# Patient Record
Sex: Female | Born: 1970 | Hispanic: No | Marital: Married | Smoking: Current some day smoker
Health system: Southern US, Community
[De-identification: ages and names within clinical notes are randomized; demographics above are authoritative.]

## PROBLEM LIST (undated history)

## (undated) DIAGNOSIS — M1990 Unspecified osteoarthritis, unspecified site: Secondary | ICD-10-CM

## (undated) DIAGNOSIS — E042 Nontoxic multinodular goiter: Secondary | ICD-10-CM

## (undated) DIAGNOSIS — J302 Other seasonal allergic rhinitis: Secondary | ICD-10-CM

## (undated) DIAGNOSIS — I10 Essential (primary) hypertension: Secondary | ICD-10-CM

## (undated) HISTORY — DX: Nontoxic multinodular goiter: E04.2

## (undated) HISTORY — PX: BUNIONECTOMY: SHX129

---

## 2001-10-23 ENCOUNTER — Encounter (INDEPENDENT_AMBULATORY_CARE_PROVIDER_SITE_OTHER): Payer: Self-pay | Admitting: *Deleted

## 2001-10-23 LAB — CONVERTED CEMR LAB

## 2001-11-08 ENCOUNTER — Encounter: Admission: RE | Admit: 2001-11-08 | Discharge: 2001-11-08 | Payer: Self-pay | Admitting: Family Medicine

## 2001-11-11 ENCOUNTER — Encounter: Admission: RE | Admit: 2001-11-11 | Discharge: 2001-11-11 | Payer: Self-pay | Admitting: Sports Medicine

## 2001-11-29 ENCOUNTER — Encounter: Admission: RE | Admit: 2001-11-29 | Discharge: 2001-11-29 | Payer: Self-pay | Admitting: Family Medicine

## 2001-12-16 ENCOUNTER — Inpatient Hospital Stay (HOSPITAL_COMMUNITY): Admission: AD | Admit: 2001-12-16 | Discharge: 2001-12-17 | Payer: Self-pay | Admitting: *Deleted

## 2003-07-17 ENCOUNTER — Encounter: Admission: RE | Admit: 2003-07-17 | Discharge: 2003-07-17 | Payer: Self-pay | Admitting: Specialist

## 2006-08-07 ENCOUNTER — Encounter: Admission: RE | Admit: 2006-08-07 | Discharge: 2006-11-05 | Payer: Self-pay | Admitting: Obstetrics and Gynecology

## 2006-10-10 ENCOUNTER — Inpatient Hospital Stay (HOSPITAL_COMMUNITY): Admission: AD | Admit: 2006-10-10 | Discharge: 2006-10-10 | Payer: Self-pay | Admitting: Obstetrics and Gynecology

## 2006-10-11 ENCOUNTER — Inpatient Hospital Stay (HOSPITAL_COMMUNITY): Admission: AD | Admit: 2006-10-11 | Discharge: 2006-10-14 | Payer: Self-pay | Admitting: Obstetrics and Gynecology

## 2006-10-11 ENCOUNTER — Encounter (INDEPENDENT_AMBULATORY_CARE_PROVIDER_SITE_OTHER): Payer: Self-pay | Admitting: Specialist

## 2006-10-15 ENCOUNTER — Encounter: Admission: RE | Admit: 2006-10-15 | Discharge: 2006-10-19 | Payer: Self-pay | Admitting: Obstetrics and Gynecology

## 2006-10-23 ENCOUNTER — Encounter (INDEPENDENT_AMBULATORY_CARE_PROVIDER_SITE_OTHER): Payer: Self-pay | Admitting: *Deleted

## 2007-10-04 ENCOUNTER — Encounter: Admission: RE | Admit: 2007-10-04 | Discharge: 2007-10-04 | Payer: Self-pay | Admitting: Family Medicine

## 2007-10-22 ENCOUNTER — Encounter (INDEPENDENT_AMBULATORY_CARE_PROVIDER_SITE_OTHER): Payer: Self-pay | Admitting: Interventional Radiology

## 2007-10-22 ENCOUNTER — Other Ambulatory Visit: Admission: RE | Admit: 2007-10-22 | Discharge: 2007-10-22 | Payer: Self-pay | Admitting: Interventional Radiology

## 2007-10-22 ENCOUNTER — Encounter: Admission: RE | Admit: 2007-10-22 | Discharge: 2007-10-22 | Payer: Self-pay | Admitting: Family Medicine

## 2007-11-05 DIAGNOSIS — R5381 Other malaise: Secondary | ICD-10-CM

## 2007-11-05 DIAGNOSIS — R5383 Other fatigue: Secondary | ICD-10-CM

## 2007-11-05 DIAGNOSIS — D72829 Elevated white blood cell count, unspecified: Secondary | ICD-10-CM | POA: Insufficient documentation

## 2007-11-05 DIAGNOSIS — R110 Nausea: Secondary | ICD-10-CM | POA: Insufficient documentation

## 2007-11-08 ENCOUNTER — Encounter: Payer: Self-pay | Admitting: Endocrinology

## 2007-12-27 ENCOUNTER — Emergency Department (HOSPITAL_COMMUNITY): Admission: EM | Admit: 2007-12-27 | Discharge: 2007-12-27 | Payer: Self-pay | Admitting: Emergency Medicine

## 2008-07-13 ENCOUNTER — Other Ambulatory Visit: Admission: RE | Admit: 2008-07-13 | Discharge: 2008-07-13 | Payer: Self-pay | Admitting: Family Medicine

## 2009-07-25 ENCOUNTER — Other Ambulatory Visit: Admission: RE | Admit: 2009-07-25 | Discharge: 2009-07-25 | Payer: Self-pay | Admitting: Family Medicine

## 2009-07-26 ENCOUNTER — Encounter: Admission: RE | Admit: 2009-07-26 | Discharge: 2009-07-26 | Payer: Self-pay | Admitting: Family Medicine

## 2009-09-30 ENCOUNTER — Emergency Department (HOSPITAL_COMMUNITY): Admission: EM | Admit: 2009-09-30 | Discharge: 2009-10-01 | Payer: Self-pay | Admitting: Emergency Medicine

## 2009-10-15 ENCOUNTER — Ambulatory Visit (HOSPITAL_COMMUNITY): Admission: RE | Admit: 2009-10-15 | Discharge: 2009-10-15 | Payer: Self-pay | Admitting: Psychiatry

## 2009-10-18 ENCOUNTER — Ambulatory Visit: Payer: Self-pay | Admitting: Psychiatry

## 2009-10-18 ENCOUNTER — Other Ambulatory Visit (HOSPITAL_COMMUNITY): Admission: RE | Admit: 2009-10-18 | Discharge: 2009-10-31 | Payer: Self-pay | Admitting: Psychiatry

## 2010-01-28 ENCOUNTER — Encounter: Admission: RE | Admit: 2010-01-28 | Discharge: 2010-01-28 | Payer: Self-pay | Admitting: Family Medicine

## 2010-02-01 ENCOUNTER — Encounter: Admission: RE | Admit: 2010-02-01 | Discharge: 2010-02-01 | Payer: Self-pay | Admitting: Family Medicine

## 2010-05-31 ENCOUNTER — Encounter: Admission: RE | Admit: 2010-05-31 | Discharge: 2010-05-31 | Payer: Self-pay | Admitting: Specialist

## 2010-09-15 ENCOUNTER — Encounter: Payer: Self-pay | Admitting: Family Medicine

## 2010-11-13 LAB — COMPREHENSIVE METABOLIC PANEL
ALT: 15 U/L (ref 0–35)
AST: 14 U/L (ref 0–37)
Albumin: 3.9 g/dL (ref 3.5–5.2)
Alkaline Phosphatase: 63 U/L (ref 39–117)
BUN: 11 mg/dL (ref 6–23)
CO2: 25 mEq/L (ref 19–32)
Calcium: 8.8 mg/dL (ref 8.4–10.5)
Chloride: 101 mEq/L (ref 96–112)
Creatinine, Ser: 0.62 mg/dL (ref 0.4–1.2)
GFR calc Af Amer: >60 mL/min (ref >60)
GFR calc non Af Amer: >60 mL/min (ref >60)
Glucose, Bld: 103 mg/dL — ABNORMAL HIGH (ref 70–99)
Potassium: 3.4 mEq/L — ABNORMAL LOW (ref 3.5–5.1)
Sodium: 135 mEq/L (ref 135–145)
Total Bilirubin: 0.4 mg/dL (ref 0.3–1.2)
Total Protein: 7 g/dL (ref 6.0–8.3)

## 2010-11-13 LAB — DIFFERENTIAL
Basophils Absolute: 0 10*3/uL (ref 0.0–0.1)
Basophils Relative: 0 % (ref 0–1)
Eosinophils Absolute: 0.1 10*3/uL (ref 0.0–0.7)
Eosinophils Relative: 1 % (ref 0–5)
Lymphocytes Relative: 23 % (ref 12–46)
Lymphs Abs: 2.3 10*3/uL (ref 0.7–4.0)
Monocytes Absolute: 0.7 10*3/uL (ref 0.1–1.0)
Monocytes Relative: 7 % (ref 3–12)
Neutro Abs: 7 10*3/uL (ref 1.7–7.7)
Neutrophils Relative %: 69 % (ref 43–77)

## 2010-11-13 LAB — URINALYSIS, ROUTINE W REFLEX MICROSCOPIC
Bilirubin Urine: NEGATIVE
Glucose, UA: NEGATIVE mg/dL
Hgb urine dipstick: NEGATIVE
Ketones, ur: NEGATIVE mg/dL
Nitrite: NEGATIVE
Protein, ur: NEGATIVE mg/dL
Specific Gravity, Urine: 1.007 (ref 1.005–1.030)
Urobilinogen, UA: 0.2 mg/dL (ref 0.0–1.0)
pH: 7.5 (ref 5.0–8.0)

## 2010-11-13 LAB — ACETAMINOPHEN LEVEL: Acetaminophen (Tylenol), Serum: <10 ug/mL — ABNORMAL LOW (ref 10–30)

## 2010-11-13 LAB — CBC
HCT: 36.3 % (ref 36.0–46.0)
Hemoglobin: 12.5 g/dL (ref 12.0–15.0)
MCHC: 34.4 g/dL (ref 30.0–36.0)
MCV: 88.7 fL (ref 78.0–100.0)
Platelets: 179 10*3/uL (ref 150–400)
RBC: 4.09 MIL/uL (ref 3.87–5.11)
RDW: 12.7 % (ref 11.5–15.5)
WBC: 10.2 10*3/uL (ref 4.0–10.5)

## 2010-11-13 LAB — RAPID URINE DRUG SCREEN, HOSP PERFORMED
Amphetamines: NOT DETECTED
Barbiturates: NOT DETECTED
Benzodiazepines: NOT DETECTED
Cocaine: NOT DETECTED
Opiates: NOT DETECTED
Tetrahydrocannabinol: NOT DETECTED

## 2010-11-13 LAB — PREGNANCY, URINE: Preg Test, Ur: NEGATIVE

## 2010-11-13 LAB — ETHANOL: Alcohol, Ethyl (B): <5 mg/dL (ref 0–10)

## 2011-01-10 NOTE — Consult Note (Signed)
NAME:  Brianna Dixon, Brianna Dixon NO.:  [id number]   MEDICAL RECORD NO.:  [id number]          PATIENT TYPE:  MAT   LOCATION:  [facility]                          FACILITY:  [facility]   PHYSICIAN:  Lenoard Aden, M.D.DATE OF BIRTH:  1971/07/18   DATE OF CONSULTATION:  DATE OF DISCHARGE:                                 CONSULTATION   CHIEF COMPLAINT:  Rule out labor.   HISTORY OF PRESENT ILLNESS:  She is a 40 year old [place] female, G5,  P4, at 61 weeks' gestation with increased frequency of contractions  today.   ALLERGIES:  SHE HAS NO KNOWN DRUG ALLERGIES.   MEDICATIONS:  Prenatal vitamins.   FAMILY HISTORY:  Hypertension, heart disease, diabetes, thyroid disease.   SOCIAL HISTORY:  She is a nonsmoker, nondrinker, denies domestic or  physical violence.  She has a personal history of no specific medical  problems.   Pregnancy complicated by gestational diabetes.  She has a history of  four uncomplicated vaginal deliveries.  Prenatal course complicated by  borderline compliance diabetes with elevated fasting and 2-hour  postprandials with marginal elevation not on any medication.  Fetal  surveillance has been otherwise reassuring.  Ultrasound is pending.   PHYSICAL EXAMINATION:  VITAL SIGNS:  Stable. She is afebrile.  HEENT:  Normal.  NECK:  Supple with full range of motion.  LUNGS:  Clear.  HEART:  Regular rhythm.  ABDOMEN:  Soft, gravid, nontender.  Breech by Leopold's.  No CVA  tenderness.  SKIN:  Intact.  NEUROLOGIC:  Nonfocal.  PELVIC:  Cervix is closed, long, presenting part is not palpable.   NST is reactive with irregular contractions noted.  Ultrasound done in  Maternity Admissions reveals a breech presentation with the head in the  left maternal upper quadrant.   IMPRESSION:  1. A 38 weeks obstetrical patient.  2. Gestational diabetes.  3. Breech malpresentation   PLAN:  1. We will evaluate for labor, cervical change noted.  2. We will proceed with  C-section.  3. Otherwise, she will followup in the office in 48 hours for a repeat      ultrasound and possible attempt at external cephalic conversion.  4. Strict labor warnings given.      Lenoard Aden, M.D.  Electronically Signed     RJT/MEDQ  D:  10/10/2006  T:  10/10/2006  Job:  [id number]   cc:   [facility]

## 2011-01-10 NOTE — Discharge Summary (Signed)
NAME:  Brianna Dixon, Brianna Dixon NO.:  [id number]   MEDICAL RECORD NO.:  [id number]          PATIENT TYPE:  INP   LOCATION:  9134                          [facility]ACILITY:  [facility]   PHYSICIAN:  Lenoard Aden, M.D.DATE OF BIRTH:  07-29-1971   DATE OF ADMISSION:  10/11/2006  DATE OF DISCHARGE:  10/14/2006                               DISCHARGE SUMMARY   The patient underwent uncomplicated C-section for breech of presentation  with spontaneous rupture of membranes and prolapsed cord at [facility] October 11, 2006. Postoperative course uncomplicated.  Discharged to home day #3. Discharge teaching done. Tylox, prenatal  vitamins, and iron given. Follow up in the office in 4-6 weeks. Birth  control to include tubal ligation performed at the time of surgery.      Lenoard Aden, M.D.  Electronically Signed     RJT/MEDQ  D:  11/13/2006  T:  11/13/2006  Job:  [id number]

## 2011-01-10 NOTE — Op Note (Signed)
NAME:  Brianna Dixon, Brianna Dixon NO.:  [id number]   MEDICAL RECORD NO.:  [id number]          PATIENT TYPE:  INP   LOCATION:  9134                          [facility]ILITY:  [facility]   PHYSICIAN:  Lenoard Aden, M.D.DATE OF BIRTH:  11/24/1970   DATE OF PROCEDURE:  10/11/2006  DATE OF DISCHARGE:                               OPERATIVE REPORT   PREOPERATIVE DIAGNOSES:  1. Intrauterine pregnancy at 38 weeks.  2. Gestational diabetes.  3. Footling breech, in active labor.  4. Spontaneous rupture of membranes with desire for elective      sterilization.  5. Prolapsed cord and foot.   POSTOPERATIVE DIAGNOSES:  1. Intrauterine pregnancy at 38 weeks.  2. Gestational diabetes.  3. Footling breech, in active labor.  4. Spontaneous rupture of membranes with desire for elective      sterilization.  5. Prolapsed cord and foot.   PROCEDURE:  1. Primary low segment transverse cesarean section.  2. Tubal ligation.   SURGEON:  Lenoard Aden, M.D.   ANESTHESIA:  Spinal.   ANESTHESIOLOGIST:  Octaviano Glow. Pamalee Leyden, M.D.   ESTIMATED BLOOD LOSS:  1500 mL.   COMPLICATIONS:  None.   DRAINS:  Foley.   COUNTS:  Correct.   DISPOSITION:  Patient to recovery in good condition.   FINDINGS:  Full-term living female, footling breech position, Apgars 8 and  9.  Cord pH 7.29.   BRIEF OPERATIVE NOTE:  After being apprised of the risks of anesthesia,  infection, bleeding, injury to abdominal organs and need for repair.  Delayed versus immediate complications to include:  bowel and bladder  injury, failure risk of tubal ligation 5-10:1,000.  The patient was  brought to the operating room.  She was administered a spinal anesthetic  without complications; prepped and draped in usual sterile fashion.  Foley catheter placed .  After attaining adequate anesthesia, dilute  Marcaine solution placed.  A Pfannenstiel skin incision made with the  scalpel, carried down to fascia -- which was nicked in the  midline and  extended transversely using Mayo scissors.  Rectus muscles dissected  sharply in midline.  Peritoneum entered sharply.  Bladder blade placed.  Visceroperitoneum scored sharply at the lower uterine segment.  Kerr  hysterotomy incision made.  Atraumatic delivery of a footling breech  presentation.  Infant handed to the pediatricians in attendance.  After  normal maneuvers, bulb suctioning done; Apgars 8 and 9.  Cord pH 7.29.  Placenta delivered manually intact from anterior position.  Cord blood  collected.  Uterus exteriorized and curetted using a dry lap pack, and  closed in 2 running imbricating layers of 0 Monocryl suture.  Multiple  interrupted sutures had to be placed at the left and right lateral edges  due to bleeding; and an O'Leary stitch was placed at the left lateral  edge to control hemostasis.  The uterine artery ligation done without  difficulty.  Good hemostasis noted.  Irrigation accomplished.  The right  tube traced out to the fimbriated end, and the right isthmic portion of  the tube was identified.  Avascular portion of the mesosalpinx was  cauterized, creating a window.  Proximal and distal plain ties were  placed.  Tubal segment was excised.  Tubal lumens were visualized and  cauterized.  The same procedure that was done on the right tube is done  on the left tube, and the left tubal segment is also excised.  At this  time good hemostasis was noted.  Irrigation re-accomplished.  Uterus  replaced into the abdominal cavity.  Bladder flap inspected, found to be  hemostatic.  Fascia then closed using a 0 Monocryl suture in running  fashion.  Subcutaneous tissue closed using 2-0 plain in a continuous  running fashion.  Skin closed using staples.  The patient tolerated this  procedure well; went to recovery in good condition.      Lenoard Aden, M.D.  Electronically Signed     RJT/MEDQ  D:  10/11/2006  T:  10/11/2006  Job:  [id number]

## 2011-03-24 ENCOUNTER — Other Ambulatory Visit: Payer: Self-pay | Admitting: Family Medicine

## 2011-03-24 DIAGNOSIS — Z1231 Encounter for screening mammogram for malignant neoplasm of breast: Secondary | ICD-10-CM

## 2011-04-03 ENCOUNTER — Ambulatory Visit
Admission: RE | Admit: 2011-04-03 | Discharge: 2011-04-03 | Disposition: A | Discharge status: Home / Self Care | Payer: Commercial Indemnity | Source: Ambulatory Visit | Attending: Family Medicine | Admitting: Family Medicine

## 2011-04-03 DIAGNOSIS — Z1231 Encounter for screening mammogram for malignant neoplasm of breast: Secondary | ICD-10-CM

## 2012-08-11 ENCOUNTER — Other Ambulatory Visit: Payer: Self-pay | Admitting: Family Medicine

## 2012-08-11 ENCOUNTER — Other Ambulatory Visit (HOSPITAL_COMMUNITY)
Admission: RE | Admit: 2012-08-11 | Discharge: 2012-08-11 | Disposition: A | Discharge status: Home / Self Care | Payer: Commercial Indemnity | Source: Ambulatory Visit | Attending: Family Medicine | Admitting: Family Medicine

## 2012-08-11 DIAGNOSIS — Z124 Encounter for screening for malignant neoplasm of cervix: Secondary | ICD-10-CM | POA: Insufficient documentation

## 2014-08-11 ENCOUNTER — Other Ambulatory Visit (HOSPITAL_COMMUNITY)
Admission: RE | Admit: 2014-08-11 | Discharge: 2014-08-11 | Disposition: A | Discharge status: Home / Self Care | Payer: BC Managed Care – PPO | Source: Ambulatory Visit | Attending: Family Medicine | Admitting: Family Medicine

## 2014-08-11 ENCOUNTER — Other Ambulatory Visit: Payer: Self-pay | Admitting: Family Medicine

## 2014-08-11 DIAGNOSIS — E049 Nontoxic goiter, unspecified: Secondary | ICD-10-CM

## 2014-08-11 DIAGNOSIS — Z1151 Encounter for screening for human papillomavirus (HPV): Secondary | ICD-10-CM | POA: Diagnosis present

## 2014-08-11 DIAGNOSIS — Z124 Encounter for screening for malignant neoplasm of cervix: Secondary | ICD-10-CM | POA: Diagnosis present

## 2014-08-14 ENCOUNTER — Ambulatory Visit
Admission: RE | Admit: 2014-08-14 | Discharge: 2014-08-14 | Disposition: A | Discharge status: Home / Self Care | Payer: BC Managed Care – PPO | Source: Ambulatory Visit | Attending: Family Medicine | Admitting: Family Medicine

## 2014-08-14 DIAGNOSIS — E049 Nontoxic goiter, unspecified: Secondary | ICD-10-CM

## 2014-08-14 LAB — CYTOLOGY - PAP

## 2014-08-29 ENCOUNTER — Other Ambulatory Visit: Payer: Self-pay

## 2014-08-29 DIAGNOSIS — Z1231 Encounter for screening mammogram for malignant neoplasm of breast: Secondary | ICD-10-CM

## 2014-08-30 ENCOUNTER — Ambulatory Visit
Admission: RE | Admit: 2014-08-30 | Discharge: 2014-08-30 | Disposition: A | Discharge status: Home / Self Care | Payer: BLUE CROSS/BLUE SHIELD | Source: Ambulatory Visit

## 2014-08-30 DIAGNOSIS — Z1231 Encounter for screening mammogram for malignant neoplasm of breast: Secondary | ICD-10-CM

## 2014-09-01 ENCOUNTER — Other Ambulatory Visit: Payer: Self-pay | Admitting: Family Medicine

## 2014-09-01 DIAGNOSIS — R928 Other abnormal and inconclusive findings on diagnostic imaging of breast: Secondary | ICD-10-CM

## 2014-09-13 ENCOUNTER — Other Ambulatory Visit: Payer: BLUE CROSS/BLUE SHIELD

## 2014-09-20 ENCOUNTER — Ambulatory Visit
Admission: RE | Admit: 2014-09-20 | Discharge: 2014-09-20 | Disposition: A | Discharge status: Home / Self Care | Payer: BLUE CROSS/BLUE SHIELD | Source: Ambulatory Visit | Attending: Family Medicine | Admitting: Family Medicine

## 2014-09-20 DIAGNOSIS — R928 Other abnormal and inconclusive findings on diagnostic imaging of breast: Secondary | ICD-10-CM

## 2014-09-23 ENCOUNTER — Emergency Department (HOSPITAL_COMMUNITY): Payer: BLUE CROSS/BLUE SHIELD

## 2014-09-23 ENCOUNTER — Emergency Department (HOSPITAL_COMMUNITY)
Admission: EM | Admit: 2014-09-23 | Discharge: 2014-09-24 | Disposition: A | Discharge status: Home / Self Care | Payer: BLUE CROSS/BLUE SHIELD | Attending: Emergency Medicine | Admitting: Emergency Medicine

## 2014-09-23 ENCOUNTER — Encounter (HOSPITAL_COMMUNITY): Payer: Self-pay | Admitting: Emergency Medicine

## 2014-09-23 DIAGNOSIS — Z3202 Encounter for pregnancy test, result negative: Secondary | ICD-10-CM | POA: Insufficient documentation

## 2014-09-23 DIAGNOSIS — R000 Tachycardia, unspecified: Secondary | ICD-10-CM | POA: Insufficient documentation

## 2014-09-23 DIAGNOSIS — R509 Fever, unspecified: Secondary | ICD-10-CM | POA: Insufficient documentation

## 2014-09-23 DIAGNOSIS — R1013 Epigastric pain: Secondary | ICD-10-CM | POA: Insufficient documentation

## 2014-09-23 DIAGNOSIS — R109 Unspecified abdominal pain: Secondary | ICD-10-CM | POA: Diagnosis present

## 2014-09-23 DIAGNOSIS — R0602 Shortness of breath: Secondary | ICD-10-CM

## 2014-09-23 LAB — URINALYSIS, ROUTINE W REFLEX MICROSCOPIC
Bilirubin Urine: NEGATIVE
Glucose, UA: NEGATIVE mg/dL
Ketones, ur: NEGATIVE mg/dL
NITRITE: NEGATIVE
PROTEIN: NEGATIVE mg/dL
Specific Gravity, Urine: 1.004 — ABNORMAL LOW (ref 1.005–1.030)
UROBILINOGEN UA: 0.2 mg/dL (ref 0.0–1.0)
pH: 7 (ref 5.0–8.0)

## 2014-09-23 LAB — CBC WITH DIFFERENTIAL/PLATELET
Basophils Absolute: 0 10*3/uL (ref 0.0–0.1)
Basophils Relative: 0 % (ref 0–1)
Eosinophils Absolute: 0 10*3/uL (ref 0.0–0.7)
Eosinophils Relative: 0 % (ref 0–5)
HCT: 36.8 % (ref 36.0–46.0)
Hemoglobin: 12.3 g/dL (ref 12.0–15.0)
LYMPHS ABS: 1.1 10*3/uL (ref 0.7–4.0)
LYMPHS PCT: 5 % — AB (ref 12–46)
MCH: 29.9 pg (ref 26.0–34.0)
MCHC: 33.4 g/dL (ref 30.0–36.0)
MCV: 89.3 fL (ref 78.0–100.0)
MONOS PCT: 3 % (ref 3–12)
Monocytes Absolute: 0.7 10*3/uL (ref 0.1–1.0)
NEUTROS ABS: 20.8 10*3/uL — AB (ref 1.7–7.7)
Neutrophils Relative %: 92 % — ABNORMAL HIGH (ref 43–77)
Platelets: 292 10*3/uL (ref 150–400)
RBC: 4.12 MIL/uL (ref 3.87–5.11)
RDW: 13.3 % (ref 11.5–15.5)
WBC: 22.7 10*3/uL — AB (ref 4.0–10.5)

## 2014-09-23 LAB — COMPREHENSIVE METABOLIC PANEL
ALK PHOS: 76 U/L (ref 39–117)
ALT: 34 U/L (ref 0–35)
ANION GAP: 8 (ref 5–15)
AST: 28 U/L (ref 0–37)
Albumin: 4.2 g/dL (ref 3.5–5.2)
BUN: 9 mg/dL (ref 6–23)
CHLORIDE: 101 mmol/L (ref 96–112)
CO2: 24 mmol/L (ref 19–32)
Calcium: 9.2 mg/dL (ref 8.4–10.5)
Creatinine, Ser: 0.62 mg/dL (ref 0.50–1.10)
GFR calc non Af Amer: >90 mL/min (ref >90)
Glucose, Bld: 161 mg/dL — ABNORMAL HIGH (ref 70–99)
Potassium: 3.8 mmol/L (ref 3.5–5.1)
SODIUM: 133 mmol/L — AB (ref 135–145)
Total Bilirubin: 0.5 mg/dL (ref 0.3–1.2)
Total Protein: 7.9 g/dL (ref 6.0–8.3)

## 2014-09-23 LAB — LIPASE, BLOOD: Lipase: 28 U/L (ref 11–59)

## 2014-09-23 LAB — CBG MONITORING, ED: Glucose-Capillary: 119 mg/dL — ABNORMAL HIGH (ref 70–99)

## 2014-09-23 LAB — URINE MICROSCOPIC-ADD ON

## 2014-09-23 LAB — I-STAT CG4 LACTIC ACID, ED: LACTIC ACID, VENOUS: 0.69 mmol/L (ref 0.5–2.0)

## 2014-09-23 LAB — POC URINE PREG, ED: PREG TEST UR: NEGATIVE

## 2014-09-23 LAB — RAPID STREP SCREEN (MED CTR MEBANE ONLY): Streptococcus, Group A Screen (Direct): NEGATIVE

## 2014-09-23 MED ORDER — KETOROLAC TROMETHAMINE 30 MG/ML IJ SOLN
30.0000 mg | Freq: Once | INTRAMUSCULAR | Status: AC
  Administered 2014-09-23: 30 mg via INTRAVENOUS
  Filled 2014-09-23: qty 1

## 2014-09-23 MED ORDER — SODIUM CHLORIDE 0.9 % IV BOLUS (SEPSIS)
2000.0000 mL | Freq: Once | INTRAVENOUS | Status: AC
Start: 2014-09-23 — End: 2014-09-23
  Administered 2014-09-23: 2000 mL via INTRAVENOUS

## 2014-09-23 MED ORDER — FAMOTIDINE IN NACL 20-0.9 MG/50ML-% IV SOLN
20.0000 mg | Freq: Once | INTRAVENOUS | Status: AC
  Administered 2014-09-23: 20 mg via INTRAVENOUS
  Filled 2014-09-23: qty 50

## 2014-09-23 MED ORDER — IOHEXOL 300 MG/ML  SOLN
100.0000 mL | Freq: Once | INTRAMUSCULAR | Status: AC | PRN
  Administered 2014-09-23: 100 mL via INTRAVENOUS

## 2014-09-23 MED ORDER — SODIUM CHLORIDE 0.9 % IV SOLN
INTRAVENOUS | Status: DC
  Administered 2014-09-23: 22:00:00 via INTRAVENOUS

## 2014-09-23 MED ORDER — IOHEXOL 300 MG/ML  SOLN
50.0000 mL | Freq: Once | INTRAMUSCULAR | Status: AC | PRN
  Administered 2014-09-23: 50 mL via ORAL

## 2014-09-23 MED ORDER — ONDANSETRON HCL 4 MG/2ML IJ SOLN
4.0000 mg | Freq: Once | INTRAMUSCULAR | Status: AC
  Administered 2014-09-23: 4 mg via INTRAVENOUS
  Filled 2014-09-23: qty 2

## 2014-09-23 NOTE — ED Notes (Signed)
Dr Allen at bedside  

## 2014-09-23 NOTE — ED Notes (Signed)
Patient now c/o sore throat, N/V and generalized pain  No active vomiting since arriving in ED

## 2014-09-23 NOTE — ED Notes (Signed)
Patient transported to CT 

## 2014-09-23 NOTE — ED Notes (Signed)
Patient previously placed on bedpan and could not urinate Patient states that she needs to use bathroom again Per Dr. Freida BusmanAllen, OK to ambulate patient to bathroom

## 2014-09-23 NOTE — ED Notes (Signed)
Per Toniann FailWendy in the lab, Lipase can be added to specimens already sent down

## 2014-09-23 NOTE — ED Notes (Signed)
Patient c/o abdominal pain started this morning. Had sore throat and fever starting this morning. Patient worked her normal job hours until 2200 yesterday. Today patient reports situation worsening. Took Tylenol this morning. No other medications taken this afternoon. No other c/c.

## 2014-09-23 NOTE — ED Notes (Signed)
Bed: [facility] Expected date:  Expected time:  Means of arrival:  Comments: 

## 2014-09-23 NOTE — ED Provider Notes (Addendum)
CSN: [id number]     Arrival date & time 09/23/14  1956 History   First MD Initiated Contact with Patient 09/23/14 2112     Chief Complaint  Patient presents with  . Abdominal Pain  . Fever     (Consider location/radiation/quality/duration/timing/severity/associated sxs/prior Treatment) HPI Comments: Patient here complaining of sore throat which began this morning with associated fever. She then developed emesis 10. Use Tylenol this morning. Denies any ear pain. Notes diffuse myalgias. Has had some midepigastric abdominal pain that began after she started vomiting. Emesis has been nonbilious and nonbloody. Denies any diarrhea. Denies any urinary symptoms. States it hurts to swallow but can control her secretions. Denies any stridor. Symptoms persistent and nothing makes them better  Patient is a 44 y.o. female presenting with abdominal pain and fever. The history is provided by the patient and the spouse.  Abdominal Pain Associated symptoms: fever   Fever   History reviewed. No pertinent past medical history. History reviewed. No pertinent past surgical history. History reviewed. No pertinent family history. History  Substance Use Topics  . Smoking status: Never Smoker   . Smokeless tobacco: Not on file  . Alcohol Use: No   OB History    No data available     Review of Systems  Constitutional: Positive for fever.  Gastrointestinal: Positive for abdominal pain.  All other systems reviewed and are negative.     Allergies  Review of patient's allergies indicates no known allergies.  Home Medications   Prior to Admission medications   Medication Sig Start Date End Date Taking? Authorizing Provider  Chlorphen-Pseudoephed-APAP (THERAFLU FLU/COLD/SORE THROAT PO) Take 2 capsules by mouth every 4 (four) hours as needed (flu-like symptoms).   Yes Historical Provider, MD   BP 125/72 mmHg  Pulse 115  Temp(Src) 101.3 F (38.5 C) (Oral)  Resp 23  SpO2 98%  LMP  09/10/2014 Physical Exam  Constitutional: She is oriented to person, place, and time. She appears well-developed and well-nourished.  Non-toxic appearance. No distress.  HENT:  Head: Normocephalic and atraumatic.  Mouth/Throat: Oropharyngeal exudate and posterior oropharyngeal erythema present. No tonsillar abscesses.  Eyes: Conjunctivae, EOM and lids are normal. Pupils are equal, round, and reactive to light.  Neck: Normal range of motion. Neck supple. No tracheal deviation present. No thyroid mass present.  Cardiovascular: Regular rhythm and normal heart sounds.  Tachycardia present.  Exam reveals no gallop.   No murmur heard. Pulmonary/Chest: Effort normal and breath sounds normal. No stridor. No respiratory distress. She has no decreased breath sounds. She has no wheezes. She has no rhonchi. She has no rales.  Abdominal: Soft. Normal appearance and bowel sounds are normal. She exhibits no distension. There is tenderness in the epigastric area. There is no rigidity, no rebound, no guarding and no CVA tenderness.    Musculoskeletal: Normal range of motion. She exhibits no edema or tenderness.  Neurological: She is alert and oriented to person, place, and time. She has normal strength. No cranial nerve deficit or sensory deficit. GCS eye subscore is 4. GCS verbal subscore is 5. GCS motor subscore is 6.  Skin: Skin is warm and dry. No abrasion and no rash noted.  Psychiatric: She has a normal mood and affect. Her speech is normal and behavior is normal.  Nursing note and vitals reviewed.   ED Course  Procedures (including critical care time) Labs Review Labs Reviewed  CBC WITH DIFFERENTIAL/PLATELET - Abnormal; Notable for the following:    WBC 22.7 (*)  Neutrophils Relative % 92 (*)    Neutro Abs 20.8 (*)    Lymphocytes Relative 5 (*)    All other components within normal limits  CBG MONITORING, ED - Abnormal; Notable for the following:    Glucose-Capillary 119 (*)    All other  components within normal limits  RAPID STREP SCREEN  URINE CULTURE  COMPREHENSIVE METABOLIC PANEL  URINALYSIS, ROUTINE W REFLEX MICROSCOPIC  POC URINE PREG, ED    Imaging Review No results found.   EKG Interpretation None      MDM   Final diagnoses:  None     Date: 09/23/2014  Rate: 120  Rhythm: sinus tachycardia  QRS Axis: normal  Intervals: normal  ST/T Wave abnormalities: normal  Conduction Disutrbances:none  Narrative Interpretation:   Old EKG Reviewed: none available  11:57 PM   abd ct neg-- Pt with leukocytois and sore thoat--strep neg, no peritonsillar abscess, pt without nuchal rigidity--no concern for meningitis, still suspect phayrngitis, will give dose of bicillin and return precautions--    Toy Baker, MD 09/24/14 0000  Toy Baker, MD 09/24/14 0001

## 2014-09-23 NOTE — ED Notes (Signed)
Pt ambulatory to the bathroom with minimal assistance 

## 2014-09-23 NOTE — ED Notes (Signed)
Patient with syncopal episode in waiting room Initial complaint of abdominal pain During triage, HR in 130s and oral temp of 100.3 Patient's husband states that RUQ pain started last night

## 2014-09-24 DIAGNOSIS — R1013 Epigastric pain: Secondary | ICD-10-CM | POA: Diagnosis not present

## 2014-09-24 MED ORDER — PENICILLIN G BENZATHINE 1200000 UNIT/2ML IM SUSP
1.2000 10*6.[IU] | Freq: Once | INTRAMUSCULAR | Status: AC
  Administered 2014-09-24: 1.2 10*6.[IU] via INTRAMUSCULAR
  Filled 2014-09-24: qty 2

## 2014-09-24 NOTE — ED Notes (Signed)
Discharge instructions reviewed with patient--agrees and verbalized understanding Patient informed of need to make and keep follow up appointment--agrees and verbalized understanding VS updated and stable--reviewed with patient at time of DC--agrees and verbalized understanding Patient alert and oriented x 4 and in NAD at time of discharge All questions related to this ED visit, DC instructions and follow up care answered to patient's satisfaction by this nurse 

## 2014-09-24 NOTE — Discharge Instructions (Signed)

## 2014-09-25 LAB — CULTURE, GROUP A STREP

## 2014-09-25 LAB — URINE CULTURE
COLONY COUNT: NO GROWTH
CULTURE: NO GROWTH

## 2015-02-15 IMAGING — CT CT ABD-PELV W/ CM
1 of 3 series · 14 of 32 positions shown, 19 images · IV contrast (OMNIPAQUE 300)
Comparison: None.

CLINICAL DATA: Generalized abdominal pain with nausea and vomiting.
Unspecified location.

EXAM:
CT ABDOMEN AND PELVIS WITH CONTRAST
TECHNIQUE: Multidetector CT imaging of the abdomen and pelvis was performed
using the standard protocol following bolus administration of
intravenous contrast.
CONTRAST:  100mL OMNIPAQUE IOHEXOL 300 MG/ML SOLN, 50mL OMNIPAQUE
IOHEXOL 300 MG/ML SOLN

[Series 2: abd/pel with · axial · 0.74mm/px · z∈[+1000,+1420]mm · 14 of 94 slices shown, 19 images]
[im 5/94  soft-tissue]
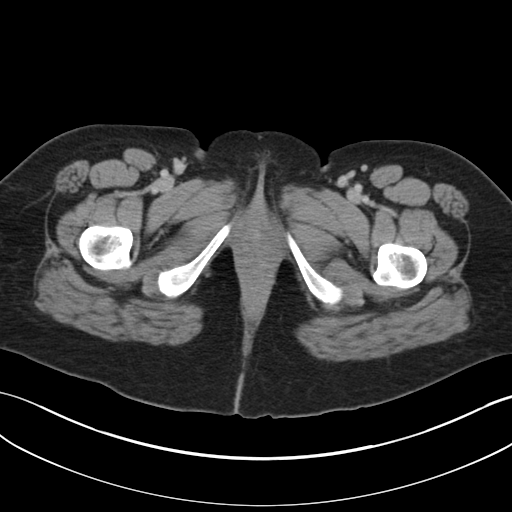
[im 5/94  bone]
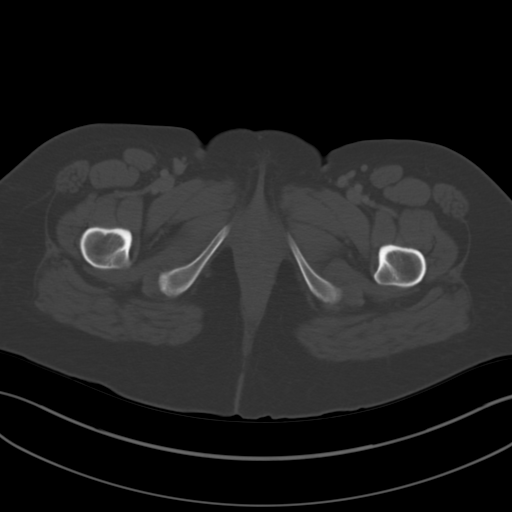
[im 15/94  soft-tissue]
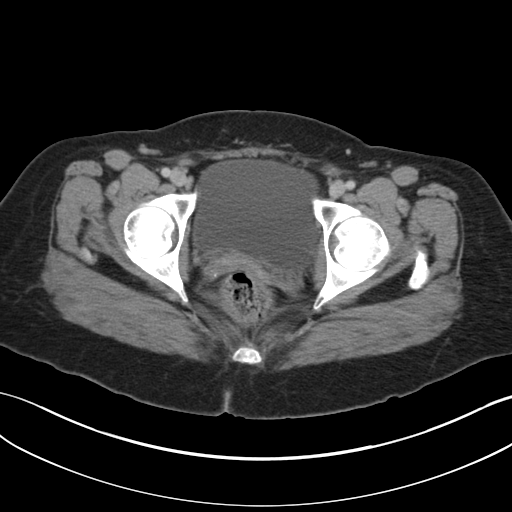
[im 20/94  soft-tissue]
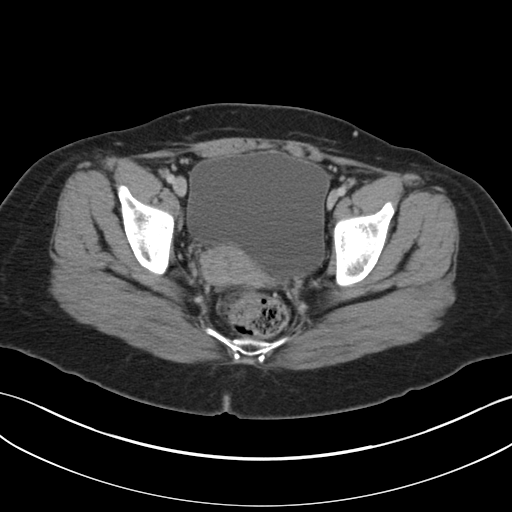
[im 25/94  soft-tissue]
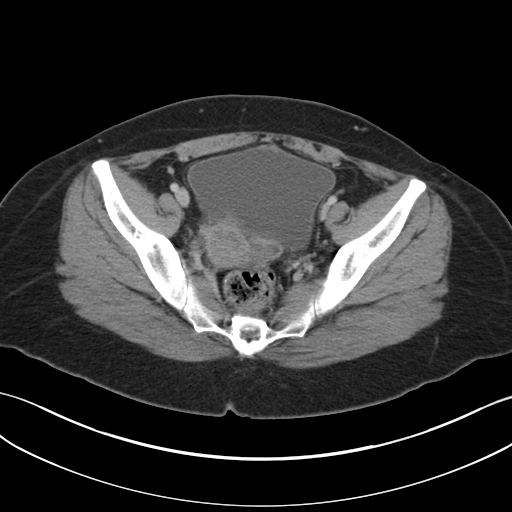
[im 35/94  soft-tissue]
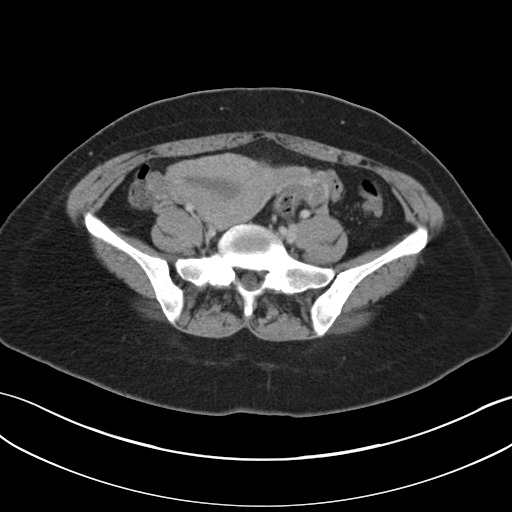
[im 40/94  soft-tissue]
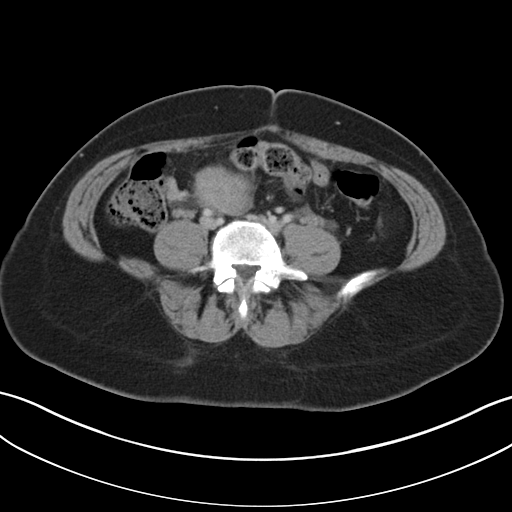
[im 49/94  soft-tissue]
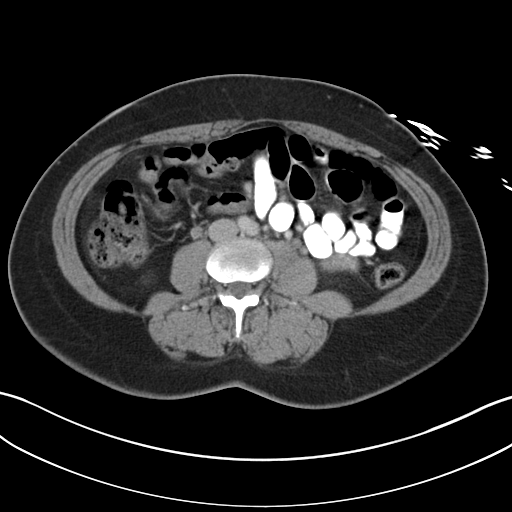
[im 54/94  soft-tissue]
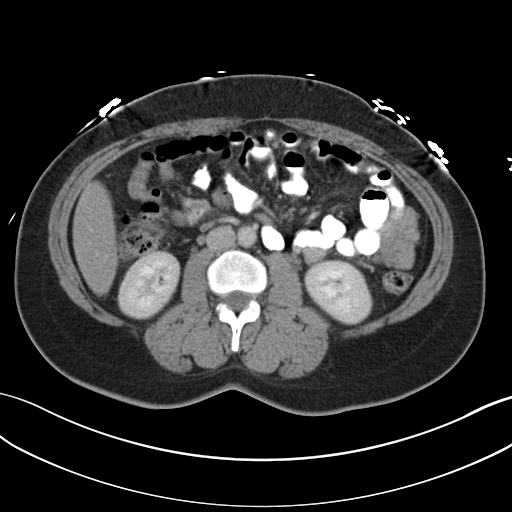
[im 59/94  soft-tissue]
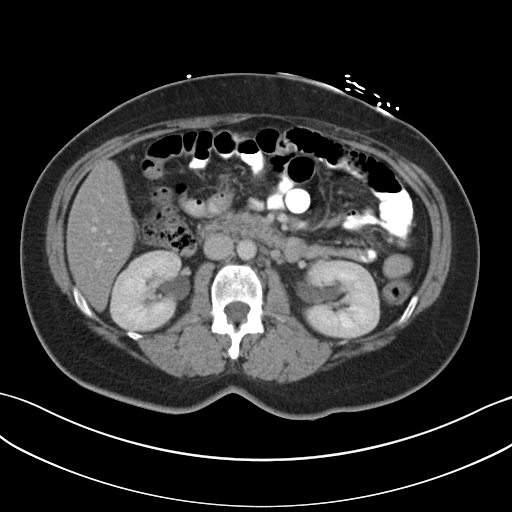
[im 59/94  bone]
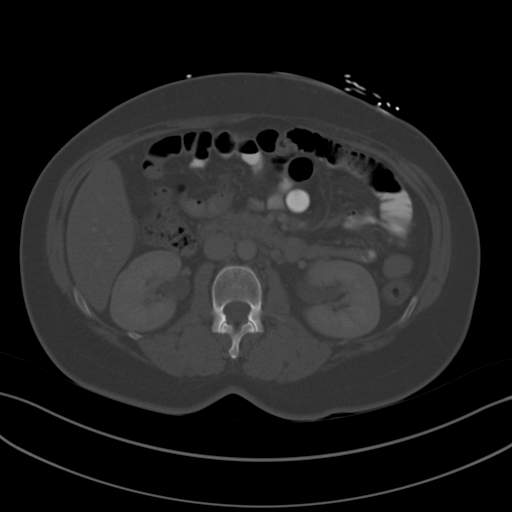
[im 69/94  soft-tissue]
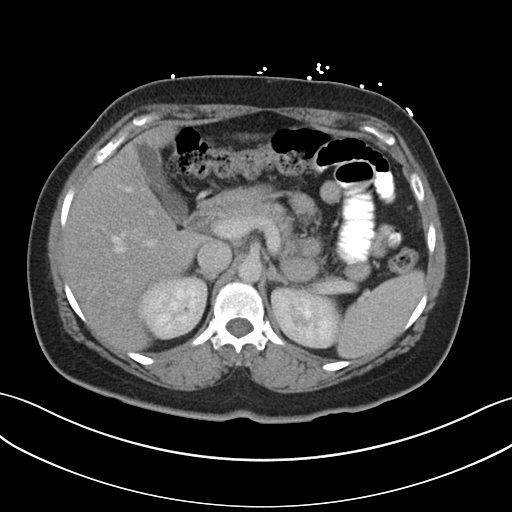
[im 74/94  soft-tissue]
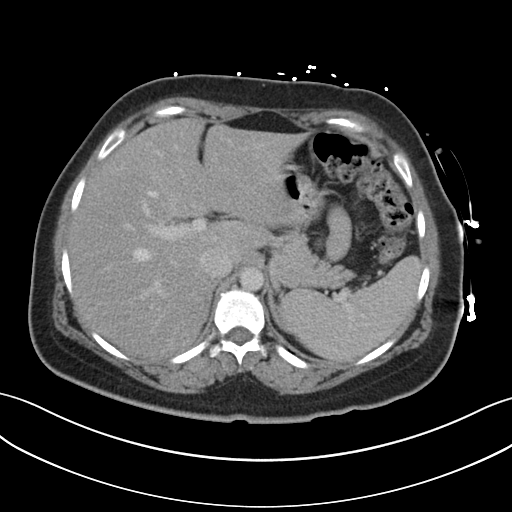
[im 74/94  lung]
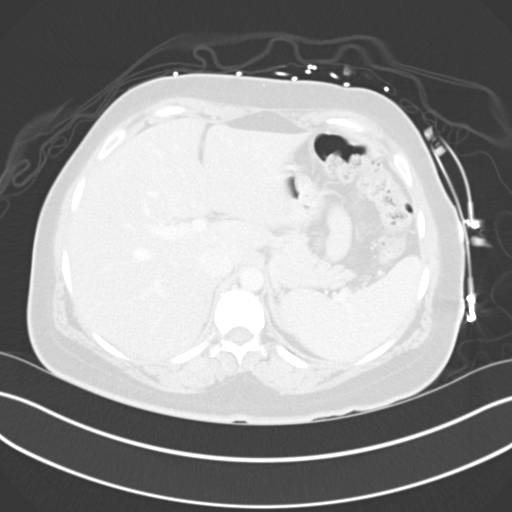
[im 79/94  soft-tissue]
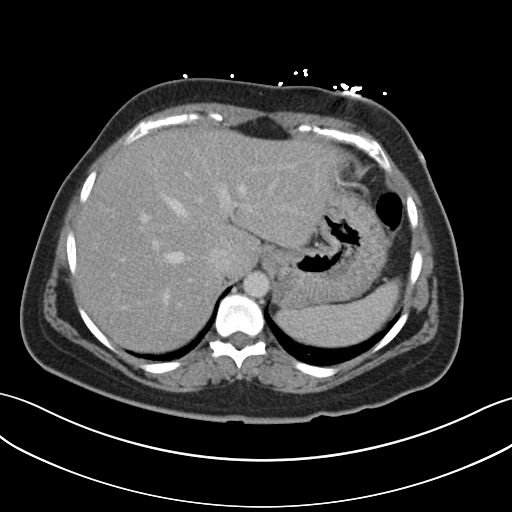
[im 79/94  lung]
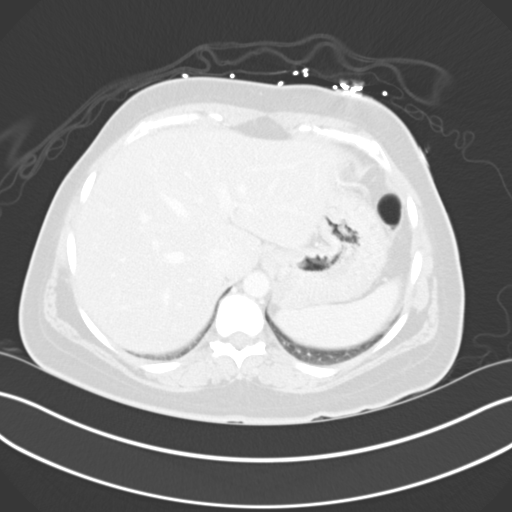
[im 84/94  lung]
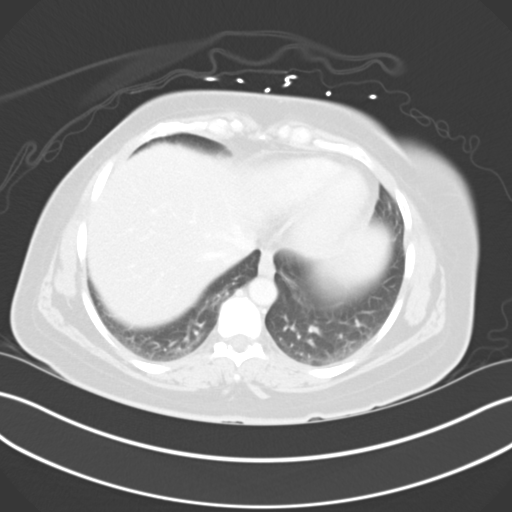
[im 89/94  soft-tissue]
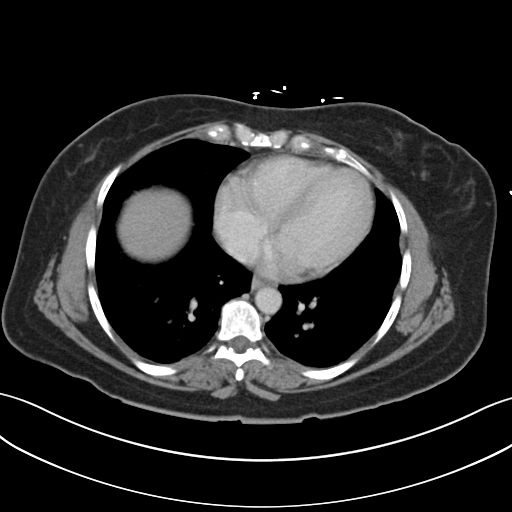
[im 89/94  lung]
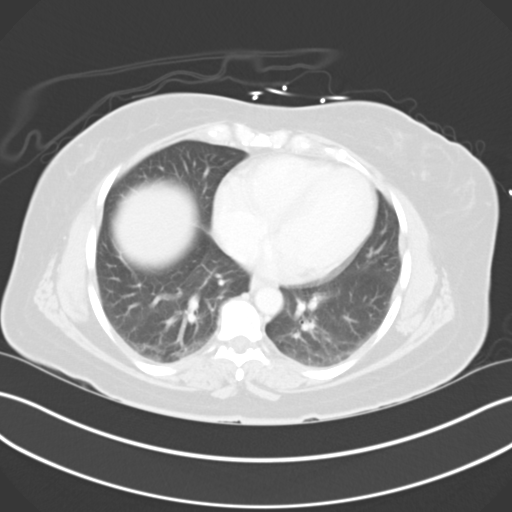

[14 of 32 positions shown; findings below may reference images not displayed]

FINDINGS: The lung bases are clear.

The liver is relatively low in attenuation as can be seen with
hepatic steatosis. There is no focal hepatic lesion. There is no
intrahepatic or extrahepatic biliary ductal dilatation. The
gallbladder is normal. The spleen demonstrates no focal
abnormality.There is a 12 mm hypodense, fluid attenuating right
renal mass most consistent with a cyst. There is a 2.7 x 1.9 cm
hypodense, fluid attenuating left renal mass most consistent with a
cyst. The adrenal glands and pancreas are normal. The bladder is
unremarkable.

The stomach, duodenum, small intestine, and large intestine
demonstrate no contrast extravasation or dilatation. There is a very
short segment enteroenteric intussusception in the left upper
quadrant (image 24/series 2). There is no pneumoperitoneum,
pneumatosis, or portal venous gas. There is no abdominal or pelvic
free fluid. There is no lymphadenopathy.

The abdominal aorta is normal in caliber .

There are no lytic or sclerotic osseous lesions. Mild broad-based
disc bulge at L4-5.
IMPRESSION: 1. Very short segment intussusception in the left upper quadrant.
These can be physiologic. There is no lead point identified.
2. Otherwise no acute abdominal or pelvic pathology.
3. Hepatic steatosis.

## 2018-04-15 ENCOUNTER — Encounter: Payer: Self-pay | Admitting: Endocrinology

## 2018-05-04 DIAGNOSIS — M21619 Bunion of unspecified foot: Secondary | ICD-10-CM | POA: Insufficient documentation

## 2018-06-03 ENCOUNTER — Encounter: Payer: Self-pay | Admitting: Endocrinology

## 2018-06-03 ENCOUNTER — Ambulatory Visit (INDEPENDENT_AMBULATORY_CARE_PROVIDER_SITE_OTHER): Payer: BLUE CROSS/BLUE SHIELD | Admitting: Endocrinology

## 2018-06-03 DIAGNOSIS — E0590 Thyrotoxicosis, unspecified without thyrotoxic crisis or storm: Secondary | ICD-10-CM

## 2018-06-03 DIAGNOSIS — E042 Nontoxic multinodular goiter: Secondary | ICD-10-CM

## 2018-06-03 NOTE — Patient Instructions (Addendum)
Please consider the treatment options we have discussed, and pet me know. If you decide to take the radioactive iodine pill, it works like this: We would first check a thyroid "scan" (a special, but easy and painless type of thyroid x ray).  you go to the x-ray department of the hospital to swallow a pill, which contains a miniscule amount of radiation.  You will not notice any symptoms from this.  You will go back to the x-ray department the next day, to lie down in front of a camera.  The results of this will be sent to me.   Based on the results, i hope to order for you a treatment pill of radioactive iodine.  Although it is a larger amount of radiation, you will again notice no symptoms from this.  The pill is gone from your body in a few days (during which you should stay away from other people), but takes several months to work.  Therefore, please return here approximately 6-8 weeks after the treatment.  This treatment has been available for many years, and the only known side-effect is an underactive thyroid.  It is possible that i would eventually prescribe for you a thyroid hormone pill, which is very inexpensive.  You don't have to worry about side-effects of this thyroid hormone pill, because it is the same molecule your thyroid makes.   Hyperthyroidism Hyperthyroidism is when the thyroid is too active (overactive). Your thyroid is a large gland that is located in your neck. The thyroid helps to control how your body uses food (metabolism). When your thyroid is overactive, it produces too much of a hormone called thyroxine. What are the causes? Causes of hyperthyroidism may include:  Graves disease. This is when your immune system attacks the thyroid gland. This is the most common cause.  Inflammation of the thyroid gland.  Tumor in the thyroid gland or somewhere else.  Excessive use of thyroid medicines, including: ? Prescription thyroid supplement. ? Herbal supplements that mimic  thyroid hormones.  Solid or fluid-filled lumps within your thyroid gland (thyroid nodules).  Excessive ingestion of iodine.  What increases the risk?  Being female.  Having a family history of thyroid conditions. What are the signs or symptoms? Signs and symptoms of hyperthyroidism may include:  Nervousness.  Inability to tolerate heat.  Unexplained weight loss.  Diarrhea.  Change in the texture of hair or skin.  Heart skipping beats or making extra beats.  Rapid heart rate.  Loss of menstruation.  Shaky hands.  Fatigue.  Restlessness.  Increased appetite.  Sleep problems.  Enlarged thyroid gland or nodules.  How is this diagnosed? Diagnosis of hyperthyroidism may include:  Medical history and physical exam.  Blood tests.  Ultrasound tests.  How is this treated? Treatment may include:  Medicines to control your thyroid.  Surgery to remove your thyroid.  Radiation therapy.  Follow these instructions at home:  Take medicines only as directed by your health care provider.  Do not use any tobacco products, including cigarettes, chewing tobacco, or electronic cigarettes. If you need help quitting, ask your health care provider.  Do not exercise or do physical activity until your health care provider approves.  Keep all follow-up appointments as directed by your health care provider. This is important. Contact a health care provider if:  Your symptoms do not get better with treatment.  You have fever.  You are taking thyroid replacement medicine and you: ? Have depression. ? Feel mentally and physically slow. ?  Have weight gain. Get help right away if:  You have decreased alertness or a change in your awareness.  You have abdominal pain.  You feel dizzy.  You have a rapid heartbeat.  You have an irregular heartbeat. This information is not intended to replace advice given to you by your health care provider. Make sure you discuss any  questions you have with your health care provider. Document Released: 08/11/2005 Document Revised: 01/10/2016 Document Reviewed: 12/27/2013 Elsevier Interactive Patient Education  2018 [employer]

## 2018-06-03 NOTE — Progress Notes (Signed)
Subjective:    Patient ID: Brianna Dixon, female    DOB: [birth year], 47 y.o.   MRN: [id number]  HPI Pt is referred by Courtney Paris, NP, for hyperthyroidism.  Pt reports he was dx'ed with hyperthyroidism in 2019.  Pt reports she was had a goiter since aprox 1999.  She had a bx in 2009, and she has had f/u US since then.  she has never been on therapy for this.  she has never had XRT to the anterior neck, or thyroid surgery.  she does not consume kelp or any other non-prescribed thyroid medication.  she has never been on amiodarone.  She has slight fullness at the ant neck, but no assoc pain.   Past Medical History:  Diagnosis Date  . Multinodular goiter     Past Surgical History:  Procedure Laterality Date  . CESAREAN SECTION      Social History   Socioeconomic History  . Marital status: Married    Spouse name: Not on file  . Number of children: Not on file  . Years of education: Not on file  . Highest education level: Not on file  Occupational History  . Not on file  Social Needs  . Financial resource strain: Not on file  . Food insecurity:    Worry: Not on file    Inability: Not on file  . Transportation needs:    Medical: Not on file    Non-medical: Not on file  Tobacco Use  . Smoking status: Current Some Day Smoker    Types: Cigarettes  . Smokeless tobacco: Never Used  Substance and Sexual Activity  . Alcohol use: No  . Drug use: Never  . Sexual activity: Not on file  Lifestyle  . Physical activity:    Days per week: Not on file    Minutes per session: Not on file  . Stress: Not on file  Relationships  . Social connections:    Talks on phone: Not on file    Gets together: Not on file    Attends religious service: Not on file    Active member of club or organization: Not on file    Attends meetings of clubs or organizations: Not on file    Relationship status: Not on file  . Intimate partner violence:    Fear of current or ex partner: Not on file   Emotionally abused: Not on file    Physically abused: Not on file    Forced sexual activity: Not on file  Other Topics Concern  . Not on file  Social History Narrative  . Not on file    No current outpatient medications on file prior to visit.   No current facility-administered medications on file prior to visit.     Allergies  Allergen Reactions  . Hydrocodone-Acetaminophen Nausea And Vomiting    Dizzy and very sleepy  (hard to wake up)    Family History  Problem Relation Age of Onset  . Hypothyroidism Son     BP 102/70 (BP Location: Left Arm)   Pulse 92   Ht 5\' 4"  (1.626 m)   Wt 175 lb 9.6 oz (79.7 kg)   LMP 05/04/2018   SpO2 98%   BMI 30.14 kg/m    Review of Systems denies weight loss, headache, hoarseness, diplopia, sob, diarrhea, polyuria, muscle weakness, edema, excessive diaphoresis, tremor, anxiety, heat intolerance, easy bruising, and rhinorrhea. She has intermitt palpitations.       Objective:   Physical Exam  VS: see vs page GEN: no distress HEAD: head: no deformity eyes: no periorbital swelling, no proptosis external nose and ears are normal mouth: no lesion seen NECK: 2 cm right thyroid nodule is palpable CHEST WALL: no deformity LUNGS: clear to auscultation CV: reg rate and rhythm, no murmur ABD: abdomen is soft, nontender.  no hepatosplenomegaly.  not distended.  no hernia MUSCULOSKELETAL: muscle bulk and strength are grossly normal.  no obvious joint swelling.  Gait: favors LLE.  EXTEMITIES: no deformity.  no edema PULSES: no carotid bruit NEURO:  cn 2-12 grossly intact.   readily moves all 4's.  sensation is intact to touch on all 4's SKIN:  Normal texture and temperature.  No rash or suspicious lesion is visible.   NODES:  None palpable at the neck PSYCH: alert, well-oriented.  Does not appear anxious nor depressed.     bx (2009): RARE ATYPICAL FOLLICULAR EPITHELIAL CELLS WITH HcRTHLE CELL FEATURES.  [place] (2017) Similar findings of multi  nodular goiter. All of the previously measured thyroid nodules (including the previously biopsied dominant approximately 2.3 cm nodule within the right-sided the thyroid isthmus) are unchanged since the 2009 examination.  [place] (2019): 21 mm RML nodule; 15 mm LMP nodule, and 10 mm LLP nodule  outside test results are reviewed: TSH=0.42, 0.24, and 0.31  I have reviewed outside records, and summarized: Pt was noted to have suppressed TSH, and referred here.  She was seen for regular physical.  Breast lump and anxiety were also addressed.      Assessment & Plan:  Multinodular goiter: new to me.  This is hereditary Hyperthyroidism: due to the goiter: mild.  This makes the risk of malignancy very low.     Patient Instructions  Please consider the treatment options we have discussed, and pet me know. If you decide to take the radioactive iodine pill, it works like this: We would first check a thyroid "scan" (a special, but easy and painless type of thyroid x ray).  you go to the x-ray department of the hospital to swallow a pill, which contains a miniscule amount of radiation.  You will not notice any symptoms from this.  You will go back to the x-ray department the next day, to lie down in front of a camera.  The results of this will be sent to me.   Based on the results, i hope to order for you a treatment pill of radioactive iodine.  Although it is a larger amount of radiation, you will again notice no symptoms from this.  The pill is gone from your body in a few days (during which you should stay away from other people), but takes several months to work.  Therefore, please return here approximately 6-8 weeks after the treatment.  This treatment has been available for many years, and the only known side-effect is an underactive thyroid.  It is possible that i would eventually prescribe for you a thyroid hormone pill, which is very inexpensive.  You don't have to worry about side-effects of this  thyroid hormone pill, because it is the same molecule your thyroid makes.   Hyperthyroidism Hyperthyroidism is when the thyroid is too active (overactive). Your thyroid is a large gland that is located in your neck. The thyroid helps to control how your body uses food (metabolism). When your thyroid is overactive, it produces too much of a hormone called thyroxine. What are the causes? Causes of hyperthyroidism may include:  Graves disease. This is when your immune  system attacks the thyroid gland. This is the most common cause.  Inflammation of the thyroid gland.  Tumor in the thyroid gland or somewhere else.  Excessive use of thyroid medicines, including: ? Prescription thyroid supplement. ? Herbal supplements that mimic thyroid hormones.  Solid or fluid-filled lumps within your thyroid gland (thyroid nodules).  Excessive ingestion of iodine.  What increases the risk?  Being female.  Having a family history of thyroid conditions. What are the signs or symptoms? Signs and symptoms of hyperthyroidism may include:  Nervousness.  Inability to tolerate heat.  Unexplained weight loss.  Diarrhea.  Change in the texture of hair or skin.  Heart skipping beats or making extra beats.  Rapid heart rate.  Loss of menstruation.  Shaky hands.  Fatigue.  Restlessness.  Increased appetite.  Sleep problems.  Enlarged thyroid gland or nodules.  How is this diagnosed? Diagnosis of hyperthyroidism may include:  Medical history and physical exam.  Blood tests.  Ultrasound tests.  How is this treated? Treatment may include:  Medicines to control your thyroid.  Surgery to remove your thyroid.  Radiation therapy.  Follow these instructions at home:  Take medicines only as directed by your health care provider.  Do not use any tobacco products, including cigarettes, chewing tobacco, or electronic cigarettes. If you need help quitting, ask your health care  provider.  Do not exercise or do physical activity until your health care provider approves.  Keep all follow-up appointments as directed by your health care provider. This is important. Contact a health care provider if:  Your symptoms do not get better with treatment.  You have fever.  You are taking thyroid replacement medicine and you: ? Have depression. ? Feel mentally and physically slow. ? Have weight gain. Get help right away if:  You have decreased alertness or a change in your awareness.  You have abdominal pain.  You feel dizzy.  You have a rapid heartbeat.  You have an irregular heartbeat. This information is not intended to replace advice given to you by your health care provider. Make sure you discuss any questions you have with your health care provider. Document Released: 08/11/2005 Document Revised: 01/10/2016 Document Reviewed: 12/27/2013 Elsevier Interactive Patient Education  2018 [employer].

## 2018-06-05 ENCOUNTER — Encounter: Payer: Self-pay | Admitting: Endocrinology

## 2018-10-07 ENCOUNTER — Ambulatory Visit (INDEPENDENT_AMBULATORY_CARE_PROVIDER_SITE_OTHER): Payer: Managed Care, Other (non HMO)

## 2018-10-07 ENCOUNTER — Ambulatory Visit: Payer: Managed Care, Other (non HMO) | Admitting: Podiatry

## 2018-10-07 DIAGNOSIS — M216X2 Other acquired deformities of left foot: Secondary | ICD-10-CM | POA: Diagnosis not present

## 2018-10-07 DIAGNOSIS — M205X1 Other deformities of toe(s) (acquired), right foot: Secondary | ICD-10-CM

## 2018-10-07 DIAGNOSIS — M7751 Other enthesopathy of right foot: Secondary | ICD-10-CM | POA: Diagnosis not present

## 2018-10-07 DIAGNOSIS — M216X1 Other acquired deformities of right foot: Secondary | ICD-10-CM

## 2018-10-07 DIAGNOSIS — M722 Plantar fascial fibromatosis: Secondary | ICD-10-CM

## 2018-10-07 DIAGNOSIS — M2011 Hallux valgus (acquired), right foot: Secondary | ICD-10-CM | POA: Diagnosis not present

## 2018-10-07 MED ORDER — MELOXICAM 15 MG PO TABS: 15.0000 mg | ORAL_TABLET | Freq: Every day | ORAL | 0 refills | Status: AC

## 2018-10-07 NOTE — Patient Instructions (Signed)

## 2018-10-11 NOTE — Progress Notes (Signed)
Subjective:   Patient ID: Brianna Dixon, female   DOB: 48 y.o.   MRN: [id number]   HPI 48 year old female presents the office today for 2 concerns.  Her primary concern is pain and stiffness to the right big toe joint.  She recently underwent a right foot cheilectomy performed on May 06, 2018 with Dr. Delford Field.  She states that after surgery she did get relief for couple months however the pain started to worsen and she has had continued swelling on the big toe joint as well.  She also avoid any further surgery and she wants to discuss other treatment options at this time.  She is also been treated previously for left heel pain, plantar fasciitis and she states that this continues as well.  She is interested in a steroid injection in the left foot today.  She stands at work as she works at [employer] and does a lot of walking as well.  She denies any recent injury.   Review of Systems  All other systems reviewed and are negative.  Past Medical History:  Diagnosis Date  . Multinodular goiter     Past Surgical History:  Procedure Laterality Date  . CESAREAN SECTION       Current Outpatient Medications:  .  ketoconazole (NIZORAL) 2 % cream, , Disp: , Rfl:  .  traMADol (ULTRAM) 50 MG tablet, Take by mouth., Disp: , Rfl:  .  meloxicam (MOBIC) 15 MG tablet, Take 1 tablet (15 mg total) by mouth daily., Disp: 30 tablet, Rfl: 0  Allergies  Allergen Reactions  . Hydrocodone-Acetaminophen Nausea And Vomiting    Dizzy and very sleepy  (hard to wake up)         Objective:  Physical Exam  General: AAO x3, NAD  Dermatological: Skin is warm, dry and supple bilateral. Nails x 10 are well manicured; remaining integument appears unremarkable at this time. There are no open sores, no preulcerative lesions, no rash or signs of infection present.  Vascular: Dorsalis Pedis artery and Posterior Tibial artery pedal pulses are 2/4 bilateral with immedate capillary fill time. Pedal hair  growth present. No varicosities and no lower extremity edema present bilateral. There is no pain with calf compression, swelling, warmth, erythema.   Neruologic: Grossly intact via light touch bilateral. Protective threshold with Semmes Wienstein monofilament intact to all pedal sites bilateral.  Musculoskeletal: There is decreased range of motion of the right first MPJ and there is localized edema to the first MPJ but there is no erythema or increase in warmth.  There is tenderness palpation on plantar medial tubercle of the calcaneus at insertion of plantar fascia on the left foot.  There is no pain upon compression of calcaneus.  There is no other areas of tenderness.  Thompson test is negative and the Achilles tendon appears to be intact.  Muscular strength 5/5 in all groups tested bilateral.  Gait: Unassisted, Nonantalgic.      Assessment:   Right first MPJ capsulitis/hallux limitus; left plantar fasciitis     Plan:  -Treatment options discussed including all alternatives, risks, and complications -Etiology of symptoms were discussed -X-rays were obtained and reviewed with the patient.  Status post cheilectomy of the right foot first MPJ.  Arthritic changes present for the MPJs.  No evidence of acute fracture bilaterally.  1.  Right first MPJ capsulitis, hallux limitus -She was to hold off on steroid injection in this area. -Prescribed mobic. Discussed side effects of the medication and directed to  stop if any are to occur and call the office.  -I do think she will benefit from custom orthotics help offload the first MPJ. Rick evaluated her today and she was measured for inserts  2.  Left heel pain, plantar fasciitis -Steroid injection performed.  See procedure note below. -Plantar fascial brace dispensed. -Night splint. -Discussed stretching, icing exercises. -Discussed shoe modifications and orthotics.  Again-custom orthotics will be helpful at this point. -Mobic as well.    Procedure: Injection Tendon/Ligament Discussed alternatives, risks, complications and verbal consent was obtained.  Location: Left plantar fascia at the glabrous junction; medial approach. Skin Prep: Alcohol. Injectate: 0.5cc 0.5% marcaine plain, 0.5 cc 2% lidocaine plain and, 1 cc kenalog 10. Disposition: Patient tolerated procedure well. Injection site dressed with a band-aid.  Post-injection care was discussed and return precautions discussed.    Return in about 3 weeks (around 10/28/2018).  Vivi Barrack DPM

## 2019-04-14 ENCOUNTER — Other Ambulatory Visit: Payer: Self-pay

## 2019-04-14 DIAGNOSIS — F17210 Nicotine dependence, cigarettes, uncomplicated: Secondary | ICD-10-CM | POA: Diagnosis not present

## 2019-04-14 DIAGNOSIS — R0981 Nasal congestion: Secondary | ICD-10-CM | POA: Diagnosis not present

## 2019-04-14 DIAGNOSIS — R05 Cough: Secondary | ICD-10-CM | POA: Insufficient documentation

## 2019-04-14 DIAGNOSIS — R079 Chest pain, unspecified: Secondary | ICD-10-CM | POA: Diagnosis not present

## 2019-04-14 DIAGNOSIS — R430 Anosmia: Secondary | ICD-10-CM | POA: Insufficient documentation

## 2019-04-14 DIAGNOSIS — Z20828 Contact with and (suspected) exposure to other viral communicable diseases: Secondary | ICD-10-CM | POA: Diagnosis not present

## 2019-04-15 ENCOUNTER — Emergency Department (HOSPITAL_COMMUNITY): Payer: Managed Care, Other (non HMO)

## 2019-04-15 ENCOUNTER — Emergency Department (HOSPITAL_COMMUNITY)
Admission: EM | Admit: 2019-04-15 | Discharge: 2019-04-15 | Disposition: A | Discharge status: Home / Self Care | Payer: Managed Care, Other (non HMO) | Attending: Emergency Medicine | Admitting: Emergency Medicine

## 2019-04-15 ENCOUNTER — Other Ambulatory Visit: Payer: Self-pay

## 2019-04-15 DIAGNOSIS — R05 Cough: Secondary | ICD-10-CM

## 2019-04-15 DIAGNOSIS — R059 Cough, unspecified: Secondary | ICD-10-CM

## 2019-04-15 MED ORDER — BENZONATATE 100 MG PO CAPS: 100.0000 mg | ORAL_CAPSULE | Freq: Three times a day (TID) | ORAL | 0 refills | Status: DC | PRN

## 2019-04-15 MED ORDER — LORATADINE 10 MG PO TABS
10.0000 mg | ORAL_TABLET | Freq: Once | ORAL | Status: AC
  Administered 2019-04-15: 10 mg via ORAL
  Filled 2019-04-15: qty 1

## 2019-04-15 MED ORDER — BENZONATATE 100 MG PO CAPS
100.0000 mg | ORAL_CAPSULE | Freq: Once | ORAL | Status: AC
  Administered 2019-04-15: 100 mg via ORAL
  Filled 2019-04-15: qty 1

## 2019-04-15 MED ORDER — ALBUTEROL SULFATE HFA 108 (90 BASE) MCG/ACT IN AERS
6.0000 | INHALATION_SPRAY | Freq: Once | RESPIRATORY_TRACT | Status: AC
  Administered 2019-04-15: 6 via RESPIRATORY_TRACT
  Filled 2019-04-15: qty 6.7

## 2019-04-15 NOTE — ED Provider Notes (Signed)
[facility]-EMERGENCY DEPT Provider Note   CSN: [id number] Arrival date & time: 04/14/19  2312     History   Chief Complaint Chief Complaint  Patient presents with  . Cough    HPI Brianna Dixon is a 48 y.o. female.     48 year old female presents to the emergency department for evaluation of cough x3 weeks.  She states that she has been using over-the-counter medications for symptoms without relief.  Her cough is been subjectively worse over the past few days.  Symptoms also associated with central chest discomfort, present with coughing only.  Endorses mild, waxing and waning congestion as well as loss of smell.  She has not had any loss of taste.  No fevers, SOB, myalgias, sore throat, V/D, sick contacts.  The history is provided by the patient. No language interpreter was used.  Cough   Past Medical History:  Diagnosis Date  . Multinodular goiter     Patient Active Problem List   Diagnosis Date Noted  . Multinodular goiter 06/03/2018  . Hyperthyroidism 06/03/2018  . Bunion 05/04/2018  . LEUKOCYTOSIS 11/05/2007  . FATIGUE 11/05/2007  . NAUSEA 11/05/2007    Past Surgical History:  Procedure Laterality Date  . CESAREAN SECTION       OB History   No obstetric history on file.      Home Medications    Prior to Admission medications   Medication Sig Start Date End Date Taking? Authorizing Provider  benzonatate (TESSALON) 100 MG capsule Take 1 capsule (100 mg total) by mouth 3 (three) times daily as needed for cough. 04/15/19   Antony MaduraHumes, Kamdon Reisig, PA-C  meloxicam (MOBIC) 15 MG tablet Take 1 tablet (15 mg total) by mouth daily. Patient not taking: Reported on 04/15/2019 10/07/18 10/07/19  Vivi BarrackWagoner, Matthew R, DPM    Family History Family History  Problem Relation Age of Onset  . Hypothyroidism Son     Social History Social History   Tobacco Use  . Smoking status: Current Some Day Smoker    Types: Cigarettes  . Smokeless tobacco: Never  Used  Substance Use Topics  . Alcohol use: No  . Drug use: Never     Allergies   Hydrocodone-acetaminophen   Review of Systems Review of Systems  Respiratory: Positive for cough.   Ten systems reviewed and are negative for acute change, except as noted in the HPI.    Physical Exam Updated Vital Signs BP (!) 132/100 (BP Location: Left Arm)   Pulse 71   Temp 97.9 F (36.6 C) (Oral)   Resp 19   Ht 5\' 4"  (1.626 m)   Wt 77.1 kg   LMP  (LMP Unknown)   SpO2 95%   BMI 29.18 kg/m   Physical Exam Vitals signs and nursing note reviewed.  Constitutional:      General: She is not in acute distress.    Appearance: She is well-developed. She is not diaphoretic.     Comments: Nontoxic-appearing and in no acute distress  HENT:     Head: Normocephalic and atraumatic.     Nose:     Comments: Mild nasal congestion    Mouth/Throat:     Comments: Normal phonation.  Tolerating secretions. Eyes:     General: No scleral icterus.    Conjunctiva/sclera: Conjunctivae normal.  Neck:     Musculoskeletal: Normal range of motion.  Cardiovascular:     Rate and Rhythm: Normal rate and regular rhythm.     Pulses: Normal pulses.  Pulmonary:     Effort: Pulmonary effort is normal. No respiratory distress.     Breath sounds: No stridor. No wheezing, rhonchi or rales.     Comments: Respirations even and unlabored.  Lungs clear to auscultation bilaterally. Musculoskeletal: Normal range of motion.  Skin:    General: Skin is warm and dry.     Coloration: Skin is not pale.     Findings: No erythema or rash.  Neurological:     Mental Status: She is alert and oriented to person, place, and time.     Coordination: Coordination normal.     Comments: GCS 15.  Moving all extremities spontaneously  Psychiatric:        Behavior: Behavior normal.      ED Treatments / Results  Labs (all labs ordered are listed, but only abnormal results are displayed) Labs Reviewed  NOVEL CORONAVIRUS, NAA  (HOSPITAL ORDER, SEND-OUT TO REF LAB)    EKG None  Radiology Dg Chest Port 1 View  Result Date: 04/15/2019 CLINICAL DATA:  Cough, fever EXAM: PORTABLE CHEST 1 VIEW COMPARISON:  Radiograph 05/31/2010 FINDINGS: No consolidation, features of edema, pneumothorax, or effusion. Pulmonary vascularity is normally distributed. The cardiomediastinal contours are unremarkable. No acute osseous or soft tissue abnormality. IMPRESSION: No acute cardiopulmonary abnormality Electronically Signed   By: Lovena Le M.D.   On: 04/15/2019 01:40    Procedures Procedures (including critical care time)  Medications Ordered in ED Medications  albuterol (VENTOLIN HFA) 108 (90 Base) MCG/ACT inhaler 6 puff (6 puffs Inhalation Given 04/15/19 0124)  benzonatate (TESSALON) capsule 100 mg (100 mg Oral Given 04/15/19 0122)  loratadine (CLARITIN) tablet 10 mg (10 mg Oral Given 04/15/19 0122)     Initial Impression / Assessment and Plan / ED Course  I have reviewed the triage vital signs and the nursing notes.  Pertinent labs & imaging results that were available during my care of the patient were reviewed by me and considered in my medical decision making (see chart for details).        48 year old female presents to the emergency department for evaluation of cough x3 weeks.  Also complaining of mild congestion and loss of smell.  No fevers.  CXR negative for acute infiltrate. Patients symptoms are consistent with URI, likely viral etiology. Discussed that antibiotics are not indicated for viral infections.  Will discharge with symptomatic treatment.  Return precautions discussed and provided. Patient discharged in stable condition with no unaddressed concerns.  Brianna Dixon was evaluated in Emergency Department on 04/15/2019 for the symptoms described in the history of present illness. She was evaluated in the context of the global COVID-19 pandemic, which necessitated consideration that the patient might be at  risk for infection with the SARS-CoV-2 virus that causes COVID-19. Institutional protocols and algorithms that pertain to the evaluation of patients at risk for COVID-19 are in a state of rapid change based on information released by regulatory bodies including the CDC and federal and state organizations. These policies and algorithms were followed during the patient's care in the ED.   Final Clinical Impressions(s) / ED Diagnoses   Final diagnoses:  Cough    ED Discharge Orders         Ordered    benzonatate (TESSALON) 100 MG capsule  3 times daily PRN     04/15/19 0235           Antonietta Breach, PA-C 04/15/19 0244    Fatima Blank, MD 04/15/19 [id number]

## 2019-04-15 NOTE — Discharge Instructions (Addendum)
Use 2 puffs of an albuterol inhaler every 4-6 hours for management of cough.  You may also take Tessalon as prescribed.  You have a coronavirus test pending.  Follow-up on these results in 48 hours.  If your test is positive, continue to quarantine for at least 14 days or until your symptoms resolved for a total of 3 days.  We recommend follow-up with a primary care doctor for recheck as needed.

## 2019-04-15 NOTE — ED Notes (Signed)
Pt spoke with family on the phone 

## 2019-04-16 LAB — NOVEL CORONAVIRUS, NAA (HOSP ORDER, SEND-OUT TO REF LAB; TAT 18-24 HRS): SARS-CoV-2, NAA: NOT DETECTED

## 2019-04-27 ENCOUNTER — Telehealth: Payer: Self-pay | Admitting: General Practice

## 2019-04-27 NOTE — Telephone Encounter (Signed)
Negative COVID results given. Patient results "NOT Detected." Caller expressed understanding. ° °

## 2020-02-16 ENCOUNTER — Telehealth: Payer: Self-pay | Admitting: Podiatry

## 2020-02-16 NOTE — Telephone Encounter (Signed)
Pt called upset about a bill she got from collections for over 300 dollars.  Upon looking pt was measured for orthotics and I spoke to her on 3.3.2020 and she was to call back to schedule an appt to pick them up. She stated she was only seen once and did not know anything about this.I did check and she did sign the financial responsibility form for 398.00 the cost of the orthotics. She asked if we still had them and I told her yes and she is going to come pick them up tomorrow.

## 2021-10-13 ENCOUNTER — Encounter (HOSPITAL_COMMUNITY): Payer: Self-pay | Admitting: Emergency Medicine

## 2021-10-13 ENCOUNTER — Other Ambulatory Visit: Payer: Self-pay

## 2021-10-13 ENCOUNTER — Emergency Department (HOSPITAL_COMMUNITY)
Admission: EM | Admit: 2021-10-13 | Discharge: 2021-10-13 | Disposition: A | Discharge status: Home / Self Care | Payer: Managed Care, Other (non HMO) | Attending: Emergency Medicine | Admitting: Emergency Medicine

## 2021-10-13 DIAGNOSIS — J029 Acute pharyngitis, unspecified: Secondary | ICD-10-CM | POA: Diagnosis present

## 2021-10-13 DIAGNOSIS — Z20822 Contact with and (suspected) exposure to covid-19: Secondary | ICD-10-CM | POA: Insufficient documentation

## 2021-10-13 DIAGNOSIS — K122 Cellulitis and abscess of mouth: Secondary | ICD-10-CM | POA: Insufficient documentation

## 2021-10-13 DIAGNOSIS — R0981 Nasal congestion: Secondary | ICD-10-CM | POA: Diagnosis not present

## 2021-10-13 LAB — GROUP A STREP BY PCR: Group A Strep by PCR: NOT DETECTED

## 2021-10-13 LAB — RESP PANEL BY RT-PCR (FLU A&B, COVID) ARPGX2
Influenza A by PCR: NEGATIVE
Influenza B by PCR: NEGATIVE
SARS Coronavirus 2 by RT PCR: NEGATIVE

## 2021-10-13 MED ORDER — DEXAMETHASONE SODIUM PHOSPHATE 10 MG/ML IJ SOLN
10.0000 mg | Freq: Once | INTRAMUSCULAR | Status: AC
  Administered 2021-10-13: 10 mg via INTRAMUSCULAR
  Filled 2021-10-13: qty 1

## 2021-10-13 MED ORDER — METHYLPREDNISOLONE 4 MG PO TBPK: ORAL_TABLET | ORAL | 0 refills | Status: DC

## 2021-10-13 MED ORDER — OXYMETAZOLINE HCL 0.05 % NA SOLN
1.0000 | Freq: Once | NASAL | Status: AC
  Administered 2021-10-13: 1 via NASAL
  Filled 2021-10-13: qty 30

## 2021-10-13 MED ORDER — KETOROLAC TROMETHAMINE 60 MG/2ML IM SOLN
60.0000 mg | Freq: Once | INTRAMUSCULAR | Status: AC
  Administered 2021-10-13: 60 mg via INTRAMUSCULAR
  Filled 2021-10-13: qty 2

## 2021-10-13 MED ORDER — KETOROLAC TROMETHAMINE 30 MG/ML IJ SOLN
30.0000 mg | Freq: Once | INTRAMUSCULAR | Status: DC
  Filled 2021-10-13: qty 1

## 2021-10-13 NOTE — ED Provider Notes (Signed)
[facility]-EMERGENCY DEPT Provider Note   CSN: [id number] Arrival date & time: 10/13/21  0145     History  Chief Complaint  Patient presents with   Sore Throat   Nasal Congestion    Brianna Dixon is a 50 y.o. female who presents with sore throat.  Patient states that she has had 3 days of very severe nasal congestion have been unable to breathe through her nares at all.  She has been taking over-the-counter allergy medications without improvement.  Today she began having pain and swelling in her uvula.  She states she can feel it dangling down the back of her throat when she swallows and it is painful.  She denies fever or chills.   Sore Throat      Home Medications Prior to Admission medications   Medication Sig Start Date End Date Taking? Authorizing Provider  methylPREDNISolone (MEDROL DOSEPAK) 4 MG TBPK tablet Use as directed 10/13/21  Yes Ammiel Guiney, PA-C  benzonatate (TESSALON) 100 MG capsule Take 1 capsule (100 mg total) by mouth 3 (three) times daily as needed for cough. 04/15/19   Antony Madura, PA-C      Allergies    Hydrocodone-acetaminophen    Review of Systems   Review of Systems  Physical Exam Updated Vital Signs BP (!) 145/92 (BP Location: Right Arm)    Pulse 92    Temp 97.9 F (36.6 C) (Oral)    Resp 16    SpO2 98%  Physical Exam Vitals and nursing note reviewed.  Constitutional:      General: She is not in acute distress.    Appearance: She is well-developed. She is not diaphoretic.  HENT:     Head: Normocephalic and atraumatic.     Right Ear: External ear normal.     Left Ear: External ear normal.     Nose: Mucosal edema present.     Mouth/Throat:     Mouth: Mucous membranes are moist.     Pharynx: Uvula midline.     Comments: Uvula erythematous and swollen Eyes:     General: No scleral icterus.    Conjunctiva/sclera: Conjunctivae normal.  Cardiovascular:     Rate and Rhythm: Normal rate and regular rhythm.      Heart sounds: Normal heart sounds. No murmur heard.   No friction rub. No gallop.  Pulmonary:     Effort: Pulmonary effort is normal. No respiratory distress.     Breath sounds: Normal breath sounds.  Abdominal:     General: Bowel sounds are normal. There is no distension.     Palpations: Abdomen is soft. There is no mass.     Tenderness: There is no abdominal tenderness. There is no guarding.  Musculoskeletal:     Cervical back: Normal range of motion.  Skin:    General: Skin is warm and dry.  Neurological:     Mental Status: She is alert and oriented to person, place, and time.  Psychiatric:        Behavior: Behavior normal.    ED Results / Procedures / Treatments   Labs (all labs ordered are listed, but only abnormal results are displayed) Labs Reviewed  GROUP A STREP BY PCR  RESP PANEL BY RT-PCR (FLU A&B, COVID) [id number]    EKG None  Radiology No results found.  Procedures Procedures    Medications Ordered in ED Medications  dexamethasone (DECADRON) injection 10 mg (10 mg Intramuscular Given 10/13/21 0320)  oxymetazoline (AFRIN) 0.05 % nasal spray  1 spray (1 spray Each Nare Given 10/13/21 0312)  ketorolac (TORADOL) injection 60 mg (60 mg Intramuscular Given 10/13/21 0329)    ED Course/ Medical Decision Making/ A&P                           Medical Decision Making Patient here with sore throat and nasal congestion Strep and resp panel negative. Patient given toradol, decadron and afrin spray- feeling much better, Will dc with medrol dosepak  Amount and/or Complexity of Data Reviewed Labs: ordered.  Risk OTC drugs. Prescription drug management.    Final Clinical Impression(s) / ED Diagnoses Final diagnoses:  Nasal congestion  Uvulitis    Rx / DC Orders ED Discharge Orders          Ordered    methylPREDNISolone (MEDROL DOSEPAK) 4 MG TBPK tablet        10/13/21 0457              Arthor Captain, PA-C 10/13/21 1751    Mesner, Barbara Cower,  MD 10/15/21 2322

## 2021-10-13 NOTE — ED Triage Notes (Signed)
Patient presents with sore throat and nasal congestion x3 days, states that she has attempted OTC medications without relief. Denies fevers, endorses some nausea and vomiting.

## 2021-10-13 NOTE — Discharge Instructions (Signed)
Contact a health care provider if: You have a fever. You have trouble eating. Your symptoms do not get better. Your symptoms come back after treatment. Get help right away if: You have trouble breathing. You have trouble swallowing. 

## 2023-01-06 ENCOUNTER — Encounter: Payer: Self-pay | Admitting: Gastroenterology

## 2023-01-12 ENCOUNTER — Ambulatory Visit: Payer: Managed Care, Other (non HMO) | Admitting: Gastroenterology

## 2023-03-31 ENCOUNTER — Encounter (HOSPITAL_BASED_OUTPATIENT_CLINIC_OR_DEPARTMENT_OTHER): Payer: Self-pay | Admitting: Emergency Medicine

## 2023-03-31 ENCOUNTER — Emergency Department (HOSPITAL_BASED_OUTPATIENT_CLINIC_OR_DEPARTMENT_OTHER): Payer: Managed Care, Other (non HMO)

## 2023-03-31 ENCOUNTER — Emergency Department (HOSPITAL_BASED_OUTPATIENT_CLINIC_OR_DEPARTMENT_OTHER)
Admission: EM | Admit: 2023-03-31 | Discharge: 2023-03-31 | Disposition: A | Discharge status: Home / Self Care | Payer: Managed Care, Other (non HMO) | Attending: Emergency Medicine | Admitting: Emergency Medicine

## 2023-03-31 ENCOUNTER — Other Ambulatory Visit: Payer: Self-pay

## 2023-03-31 DIAGNOSIS — U071 COVID-19: Secondary | ICD-10-CM | POA: Insufficient documentation

## 2023-03-31 DIAGNOSIS — R059 Cough, unspecified: Secondary | ICD-10-CM | POA: Diagnosis present

## 2023-03-31 LAB — CBC WITH DIFFERENTIAL/PLATELET
Abs Immature Granulocytes: 0.03 10*3/uL (ref 0.00–0.07)
Basophils Absolute: 0 10*3/uL (ref 0.0–0.1)
Basophils Relative: 0 %
Eosinophils Absolute: 0.1 10*3/uL (ref 0.0–0.5)
Eosinophils Relative: 1 %
HCT: 39.9 % (ref 36.0–46.0)
Hemoglobin: 13.5 g/dL (ref 12.0–15.0)
Immature Granulocytes: 0 %
Lymphocytes Relative: 32 %
Lymphs Abs: 2.9 10*3/uL (ref 0.7–4.0)
MCH: 29.6 pg (ref 26.0–34.0)
MCHC: 33.8 g/dL (ref 30.0–36.0)
MCV: 87.5 fL (ref 80.0–100.0)
Monocytes Absolute: 0.8 10*3/uL (ref 0.1–1.0)
Monocytes Relative: 9 %
Neutro Abs: 5.4 10*3/uL (ref 1.7–7.7)
Neutrophils Relative %: 58 %
Platelets: 303 10*3/uL (ref 150–400)
RBC: 4.56 MIL/uL (ref 3.87–5.11)
RDW: 13.4 % (ref 11.5–15.5)
WBC: 9.2 10*3/uL (ref 4.0–10.5)
nRBC: 0 % (ref 0.0–0.2)

## 2023-03-31 LAB — COMPREHENSIVE METABOLIC PANEL
ALT: 84 U/L — ABNORMAL HIGH (ref 0–44)
AST: 45 U/L — ABNORMAL HIGH (ref 15–41)
Albumin: 3.8 g/dL (ref 3.5–5.0)
Alkaline Phosphatase: 85 U/L (ref 38–126)
Anion gap: 13 (ref 5–15)
BUN: 9 mg/dL (ref 6–20)
CO2: 22 mmol/L (ref 22–32)
Calcium: 8.7 mg/dL — ABNORMAL LOW (ref 8.9–10.3)
Chloride: 99 mmol/L (ref 98–111)
Creatinine, Ser: 0.65 mg/dL (ref 0.44–1.00)
GFR, Estimated: >60 mL/min (ref >60)
Glucose, Bld: 177 mg/dL — ABNORMAL HIGH (ref 70–99)
Potassium: 3.3 mmol/L — ABNORMAL LOW (ref 3.5–5.1)
Sodium: 134 mmol/L — ABNORMAL LOW (ref 135–145)
Total Bilirubin: 0.6 mg/dL (ref 0.3–1.2)
Total Protein: 7.8 g/dL (ref 6.5–8.1)

## 2023-03-31 LAB — URINALYSIS, ROUTINE W REFLEX MICROSCOPIC
Bilirubin Urine: NEGATIVE
Glucose, UA: NEGATIVE mg/dL
Ketones, ur: NEGATIVE mg/dL
Nitrite: NEGATIVE
Protein, ur: NEGATIVE mg/dL
Specific Gravity, Urine: 1.02 (ref 1.005–1.030)
pH: 6.5 (ref 5.0–8.0)

## 2023-03-31 LAB — SARS CORONAVIRUS 2 BY RT PCR: SARS Coronavirus 2 by RT PCR: POSITIVE — AB

## 2023-03-31 LAB — URINALYSIS, MICROSCOPIC (REFLEX)

## 2023-03-31 LAB — LIPASE, BLOOD: Lipase: 34 U/L (ref 11–51)

## 2023-03-31 LAB — TROPONIN I (HIGH SENSITIVITY): Troponin I (High Sensitivity): <2 ng/L (ref <18)

## 2023-03-31 LAB — CBG MONITORING, ED: Glucose-Capillary: 189 mg/dL — ABNORMAL HIGH (ref 70–99)

## 2023-03-31 MED ORDER — ONDANSETRON 4 MG PO TBDP: 4.0000 mg | ORAL_TABLET | Freq: Three times a day (TID) | ORAL | 0 refills | Status: DC | PRN

## 2023-03-31 MED ORDER — DIPHENHYDRAMINE HCL 50 MG/ML IJ SOLN
12.5000 mg | Freq: Once | INTRAMUSCULAR | Status: AC
  Administered 2023-03-31: 12.5 mg via INTRAVENOUS
  Filled 2023-03-31: qty 1

## 2023-03-31 MED ORDER — ONDANSETRON HCL 4 MG/2ML IJ SOLN
4.0000 mg | Freq: Once | INTRAMUSCULAR | Status: AC
  Administered 2023-03-31: 4 mg via INTRAVENOUS
  Filled 2023-03-31: qty 2

## 2023-03-31 MED ORDER — PROCHLORPERAZINE EDISYLATE 10 MG/2ML IJ SOLN
10.0000 mg | Freq: Once | INTRAMUSCULAR | Status: AC
  Administered 2023-03-31: 10 mg via INTRAVENOUS
  Filled 2023-03-31: qty 2

## 2023-03-31 MED ORDER — SODIUM CHLORIDE 0.9 % IV BOLUS
1000.0000 mL | Freq: Once | INTRAVENOUS | Status: AC
  Administered 2023-03-31: 1000 mL via INTRAVENOUS

## 2023-03-31 MED ORDER — KETOROLAC TROMETHAMINE 30 MG/ML IJ SOLN
30.0000 mg | Freq: Once | INTRAMUSCULAR | Status: AC
  Administered 2023-03-31: 30 mg via INTRAVENOUS
  Filled 2023-03-31: qty 1

## 2023-03-31 NOTE — ED Triage Notes (Signed)
URI Sx X 5 days, c/o weakness, dizziness in triage. Pt is adiaphoretic in triage

## 2023-03-31 NOTE — Discharge Instructions (Addendum)
You were seen in the emergency department for nausea vomiting cough fatigue for the last week.  You tested positive for COVID.  The treatment is supportive, fluids rest Tylenol ibuprofen.  Follow-up with your regular doctor.  Return to the emergency department if any worsening or concerning symptoms.

## 2023-03-31 NOTE — ED Provider Notes (Signed)
Signout from Dr. Judd Lien.  52 year old female has been sick for a week with cough nausea vomiting diarrhea dizziness.  No sick contacts.  Getting lab work chest x-ray urinalysis COVID swab.  Disposition per results of testing. Physical Exam  BP 100/74 (BP Location: Right Arm)   Pulse 86   Temp 99 F (37.2 C) (Oral)   Resp (!) 23   Ht 5\' 4"  (1.626 m)   Wt 82.6 kg   LMP  (LMP Unknown)   SpO2 95%   BMI 31.26 kg/m   Physical Exam  Procedures  Procedures  ED Course / MDM    Medical Decision Making Amount and/or Complexity of Data Reviewed Labs: ordered. Radiology: ordered.  Risk Prescription drug management.   Patient's workup significant for positive COVID test.  The rest of her lab work was fairly unremarkable, mildly low potassium elevated glucose, mild elevation of AST and ALT consistent with COVID.  Chest x-ray not showing any acute infiltrate and room air sat 95%.  I reviewed all this with her.  She has received IV fluids.  We discussed symptomatic treatment at home.  Hopefully she can give a urine sample we can also check.  9 AM.  Patient's urine without signs of infection.  I talked to her about discharge but she said her headache is returned and she does not feel any better.  She thinks she needs to be admitted to the hospital until she feels better.  I talked with her about some more fluids and other symptomatic treatment and so we will try that.  1020. Patient improved after more fluids, comapzine. She is comfortable with discharge and will followup with pcp. Return instructions discussed.      Terrilee Files, MD 03/31/23 [phone]

## 2023-03-31 NOTE — ED Notes (Signed)
This nurse went in to discharge pt. Pt states that she doesn't want to go home and that she doesn't feel good and that she is weak. EDP aware and will re-evaluate pt

## 2023-03-31 NOTE — ED Provider Notes (Signed)
[facility] AT [facility] Provider Note   CSN: [id number] Arrival date & time: 03/31/23  0602     History  Chief Complaint  Patient presents with   URI    Brianna Dixon is a 52 y.o. female.  Patient is a 52 year old female with no significant past medical history presenting with complaints of cough, nausea, vomiting, and diarrhea for the past week.  For the past few days, she has reported dizziness when she stands, weakness, and feeling generally unwell.  No ill contacts.  No dark stools.  She does reports intermittent fevers.    The history is provided by the patient.       Home Medications Prior to Admission medications   Medication Sig Start Date End Date Taking? Authorizing Provider  benzonatate (TESSALON) 100 MG capsule Take 1 capsule (100 mg total) by mouth 3 (three) times daily as needed for cough. 04/15/19   Antony Madura, PA-C  methylPREDNISolone (MEDROL DOSEPAK) 4 MG TBPK tablet Use as directed 10/13/21   Arthor Captain, PA-C      Allergies    Hydrocodone-acetaminophen    Review of Systems   Review of Systems  All other systems reviewed and are negative.   Physical Exam Updated Vital Signs BP 100/74 (BP Location: Right Arm)   Pulse 86   Temp 99 F (37.2 C) (Oral)   Resp (!) 23   Ht 5\' 4"  (1.626 m)   Wt 82.6 kg   LMP  (LMP Unknown)   SpO2 95%   BMI 31.26 kg/m  Physical Exam Vitals and nursing note reviewed.  Constitutional:      General: She is not in acute distress.    Appearance: She is well-developed. She is not diaphoretic.     Comments: Patient is somewhat pale-appearing.  HENT:     Head: Normocephalic and atraumatic.  Cardiovascular:     Rate and Rhythm: Normal rate and regular rhythm.     Heart sounds: No murmur heard.    No friction rub. No gallop.  Pulmonary:     Effort: Pulmonary effort is normal. No respiratory distress.     Breath sounds: Normal breath sounds. No wheezing.  Abdominal:     General:  Bowel sounds are normal. There is no distension.     Palpations: Abdomen is soft.     Tenderness: There is no abdominal tenderness.  Musculoskeletal:        General: Normal range of motion.     Cervical back: Normal range of motion and neck supple.  Skin:    General: Skin is warm and dry.  Neurological:     General: No focal deficit present.     Mental Status: She is alert and oriented to person, place, and time.     ED Results / Procedures / Treatments   Labs (all labs ordered are listed, but only abnormal results are displayed) Labs Reviewed  CBG MONITORING, ED - Abnormal; Notable for the following components:      Result Value   Glucose-Capillary 189 (*)    All other components within normal limits  SARS CORONAVIRUS 2 BY RT PCR  COMPREHENSIVE METABOLIC PANEL  LIPASE, BLOOD  CBC WITH DIFFERENTIAL/PLATELET  URINALYSIS, ROUTINE W REFLEX MICROSCOPIC  TROPONIN I (HIGH SENSITIVITY)    EKG EKG Interpretation Date/Time:  Tuesday March 31 2023 06:16:12 EDT Ventricular Rate:  85 PR Interval:  150 QRS Duration:  87 QT Interval:  388 QTC Calculation: 462 R Axis:  30  Text Interpretation: Sinus rhythm Normal ECG Confirmed by Geoffery Lyons ([id number]) on 03/31/2023 6:25:25 AM  Radiology No results found.  Procedures Procedures    Medications Ordered in ED Medications  ondansetron (ZOFRAN) injection 4 mg (has no administration in time range)  sodium chloride 0.9 % bolus 1,000 mL (has no administration in time range)    ED Course/ Medical Decision Making/ A&P  Patient presenting with complaints of cough, nausea, vomiting, and diarrhea for the past week.  She also feels dizzy occasionally when she stands.  She also reports fever to 103.  Patient arrives here afebrile with stable vital signs.  Physical examination reveals her to be somewhat pale, but exam is otherwise unremarkable.  Workup initiated including CBC, metabolic panel, troponin, and respiratory panel.  IV fluids  also ordered and initiated.  Care signed out to Dr. Charm Barges at shift change to obtain the results of the above studies and determine the final disposition.  Final Clinical Impression(s) / ED Diagnoses Final diagnoses:  None    Rx / DC Orders ED Discharge Orders     None         Geoffery Lyons, MD 03/31/23 2349

## 2023-03-31 NOTE — ED Notes (Signed)
XR at bedside

## 2024-06-19 ENCOUNTER — Emergency Department (HOSPITAL_COMMUNITY): Payer: MEDICAID

## 2024-06-19 ENCOUNTER — Emergency Department (HOSPITAL_COMMUNITY)
Admission: EM | Admit: 2024-06-19 | Discharge: 2024-06-19 | Disposition: A | Discharge status: Home / Self Care | Payer: MEDICAID | Attending: Emergency Medicine | Admitting: Emergency Medicine

## 2024-06-19 ENCOUNTER — Encounter (HOSPITAL_COMMUNITY): Payer: Self-pay | Admitting: Emergency Medicine

## 2024-06-19 ENCOUNTER — Other Ambulatory Visit: Payer: Self-pay

## 2024-06-19 DIAGNOSIS — E039 Hypothyroidism, unspecified: Secondary | ICD-10-CM | POA: Insufficient documentation

## 2024-06-19 DIAGNOSIS — W108XXA Fall (on) (from) other stairs and steps, initial encounter: Secondary | ICD-10-CM | POA: Insufficient documentation

## 2024-06-19 DIAGNOSIS — S82851A Displaced trimalleolar fracture of right lower leg, initial encounter for closed fracture: Secondary | ICD-10-CM | POA: Insufficient documentation

## 2024-06-19 MED ORDER — OXYCODONE HCL 5 MG PO TABS
5.0000 mg | ORAL_TABLET | Freq: Once | ORAL | Status: AC
  Administered 2024-06-19: 5 mg via ORAL
  Filled 2024-06-19: qty 1

## 2024-06-19 MED ORDER — OXYCODONE HCL 5 MG PO TABS: 5.0000 mg | ORAL_TABLET | Freq: Four times a day (QID) | ORAL | 0 refills | Status: DC | PRN

## 2024-06-19 MED ORDER — LIDOCAINE HCL (PF) 1 % IJ SOLN
10.0000 mL | Freq: Once | INTRAMUSCULAR | Status: DC
  Filled 2024-06-19: qty 30

## 2024-06-19 MED ORDER — ACETAMINOPHEN 325 MG PO TABS
650.0000 mg | ORAL_TABLET | Freq: Once | ORAL | Status: AC
  Administered 2024-06-19: 650 mg via ORAL
  Filled 2024-06-19: qty 2

## 2024-06-19 MED ORDER — FENTANYL CITRATE (PF) 50 MCG/ML IJ SOSY
50.0000 ug | PREFILLED_SYRINGE | Freq: Once | INTRAMUSCULAR | Status: AC
  Administered 2024-06-19: 50 ug via INTRAVENOUS
  Filled 2024-06-19: qty 1

## 2024-06-19 MED ORDER — IBUPROFEN 800 MG PO TABS
800.0000 mg | ORAL_TABLET | Freq: Once | ORAL | Status: AC
  Administered 2024-06-19: 800 mg via ORAL
  Filled 2024-06-19: qty 1

## 2024-06-19 MED ORDER — LIDOCAINE HCL 1 % IJ SOLN
INTRAMUSCULAR | Status: AC
  Administered 2024-06-19: 20 mL
  Filled 2024-06-19: qty 20

## 2024-06-19 NOTE — Consult Note (Signed)
 ORTHOPAEDIC CONSULTATION  REQUESTING PHYSICIAN: Garrick Charleston, MD  PCP:  [facility] [place]  Chief Complaint: Right ankle pain  HPI: Brianna Dixon is a 53 y.o. female who complains of  right ankle pain s/p fall while climbing stairs.  She states that the pain is localized to the right ankle and is worse with movement and better with rest. She describes the pain as sharp and severe. She has no numbness or tingling of the right lower extremity.  She has never had an ankle fracture in the past. She states that she has no history of diabetes, blood thinners and does not smoke. She has no allergies to medications, but does get nauseous with hydrocodone.  Past Medical History:  Diagnosis Date   Multinodular goiter    Past Surgical History:  Procedure Laterality Date   CESAREAN SECTION     Social History   Socioeconomic History   Marital status: Married    Spouse name: Not on file   Number of children: Not on file   Years of education: Not on file   Highest education level: Not on file  Occupational History   Not on file  Tobacco Use   Smoking status: Some Days    Types: Cigarettes   Smokeless tobacco: Never  Substance and Sexual Activity   Alcohol use: Never   Drug use: Never   Sexual activity: Not on file  Other Topics Concern   Not on file  Social History Narrative   Not on file   Social Drivers of Health   Financial Resource Strain: Not on file  Food Insecurity: No Food Insecurity (06/15/2024)   Received from [facility] ([facility]   Hunger Vital Sign    Within the past 12 months, you worried that your food would run out before you got the money to buy more.: Never true    Within the past 12 months, the food you bought just didn't last and you didn't have money to get more.: Never true  Transportation Needs: No Transportation Needs (06/15/2024)   Received from [employer] ([facility])   PRAPARE - Transportation    In  the past 12 months, has lack of transportation kept you from medical appointments or from getting medications?: No    In the past 12 months, has lack of transportation kept you from meetings, work, or from getting things needed for daily living?: No  Physical Activity: Insufficiently Active (06/15/2024)   Received from [employer] ([facility])   Exercise Vital Sign    On average, how many days per week do you engage in moderate to strenuous exercise (like a brisk walk)?: 4 days    On average, how many minutes do you engage in exercise at this level?: 20 min  Stress: Not on file  Social Connections: Moderately Isolated (06/15/2024)   Received from [employer] ([facility])   Social Connection and Isolation Panel    In a typical week, how many times do you talk on the phone with family, friends, or neighbors?: More than three times a week    How often do you get together with friends or relatives?: More than three times a week    How often do you attend church or religious services?: 1 to 4 times per year    Do you belong to any clubs or organizations such as church groups, unions, fraternal or athletic groups, or school groups?: No    How often do you attend  meetings of the clubs or organizations you belong to?: Never    Are you married, widowed, divorced, separated, never married, or living with a partner?: Separated   Family History  Problem Relation Age of Onset   Hypothyroidism Son    Allergies  Allergen Reactions   Hydrocodone-Acetaminophen  Nausea And Vomiting    Dizzy and very sleepy  (hard to wake up)   Prior to Admission medications   Medication Sig Start Date End Date Taking? Authorizing Provider  benzonatate  (TESSALON ) 100 MG capsule Take 1 capsule (100 mg total) by mouth 3 (three) times daily as needed for cough. 04/15/19   Keith Sor, PA-C  methylPREDNISolone  (MEDROL  DOSEPAK) 4 MG TBPK tablet Use as directed 10/13/21   Harris, Abigail, PA-C  ondansetron   (ZOFRAN -ODT) 4 MG disintegrating tablet Take 1 tablet (4 mg total) by mouth every 8 (eight) hours as needed for nausea or vomiting. 03/31/23   Towana Ozell BROCKS, MD   DG Ankle Complete Right Result Date: 06/19/2024 EXAM: 3 or more VIEW(S) XRAY OF THE RIGHT ANKLE 06/19/2024 04:56:00 PM CLINICAL HISTORY: deformity. Pt from home with right ankle pain/deformity from fall. COMPARISON: None available. FINDINGS: BONES AND JOINTS: There is an acute transverse fracture through the medial malleolus with fracture fragments distracted 2.5 mm. There is an acute oblique fracture through the distal fibula with fracture fragments distracted up to 2.5 mm. There is an acute posterior malleolar fracture with fragments distracted 2 mm. There is no evidence for dislocation. SOFT TISSUES: There is soft tissue swelling surrounding the ankle. IMPRESSION: 1. Acute trimalleolar ankle fracture. 2. No evidence for dislocation. Electronically signed by: Greig Pique MD 06/19/2024 05:17 PM EDT RP Workstation: [id number]    Positive ROS: All other systems have been reviewed and were otherwise negative with the exception of those mentioned in the HPI and as above.  Physical Exam: General: Alert, no acute distress Cardiovascular: No pedal edema Respiratory: No cyanosis, no use of accessory musculature GI: No organomegaly, abdomen is soft and non-tender Skin: No lesions in the area of chief complaint Neurologic: Sensation intact distally Psychiatric: Patient is competent for consent with normal mood and affect Lymphatic: No axillary or cervical lymphadenopathy  MUSCULOSKELETAL:  Right lower extremity - skin intact - swelling and ecchymosis of the ankle. Unable to wrinkle the skin - TTP medial, lateral and posterior ankle - painful ROM of the ankle - sensation intact to light touch sural, saphenous, tibial, superficial and deep peroneal nerve distributions - able to actively flex and extend the toes. - 2+ dp  pulse  Imaging:  X-rays: 3 views of  right were obtained and reviewed.  My independent interpretation is as follows: trimalleolar ankle fracture with displacement.  Procedure: The skin was cleaned using alcohol and then a 22 gauge needle was inserted via an anteromedial portal and a hematoma block was performed.  A reduction maneuver was then performed and the patient was placed in a well padded posterior slab splint with stirrups. Fracture reduction was confirmed with x-ray. The patient tolerated the procedure well and was neurovascularly intact pre and post the procedure.  Assessment: 53 year old female with a right displaced trimalleolar ankle s/p closed reduction. The patient will require operative intervention in the form of ORIF of the trimalleolar ankle fracture once her swelling resolves on an outpatient basis.  She will continue to be NWB RLE in her splint, ice and elevate the extremity to reduce swelling. Return to clinic in 1 week for skin check.  Plan: NWB RLE  in splint Pain control ASA 81 BID x 4 weeks Ice and elevated extremity. Will follow up in my clinic in 1 week for skin check.    Rankin LELON Pizza, MD    06/19/2024 8:09 PM

## 2024-06-19 NOTE — ED Triage Notes (Signed)
 Pt arriving via [facility] from home with right ankle pain/deformity from fall. Pt reports she slipped on her stairs and twisted her ankle. No LOC, did not his her head. No thinners. Pt A&O x4 upon arrival. Pain 8/10 at this time.

## 2024-06-19 NOTE — Discharge Instructions (Addendum)
 It was a pleasure taking care of you today.  Based on your x-rays today you have what is called a trimalleolar fracture of your right ankle, because of this you have been placed in a splint.  Please remain nonweightbearing and use your crutches.  For pain recommend over-the-counter Tylenol  and recommend that you start taking an 81mg  Aspirin for at least 4 weeks following this injury.  I have also sent in a few doses of narcotic pain medication called oxycodone, this medication may make you drowsy so please do not drive or operate heavy machinery after taking this medicine.  I have attached the phone number for [facility], please call and make an appointment as soon as possible for a follow-up appointment.  Based off of your evaluation today it does appear that this ankle injury will be surgical.  If you experience any following symptoms including but not limited to severe pain, severe swelling of your right leg and ankle, color changes of the toes, or other concerning symptoms please return to the emergency department or seek further medical evaluation.  Please make your primary care provider aware of your workup and findings today. You may look on [employer] to try and find the cheapest pharmacy. Also recommend rest, ice, compression, as well as elevation to help with symptoms.

## 2024-06-19 NOTE — ED Notes (Signed)
 Called ortho x2 no answer; will call back

## 2024-06-20 NOTE — ED Provider Notes (Signed)
 [place] [facility]ERGENCY DEPARTMENT AT [facility] Provider Note   CSN: [id number] Arrival date & time: 06/19/24  1559     Patient presents with: Fall and Ankle Pain (Right)   Brianna Dixon is a 53 y.o. female who presents to the emergency department with a chief complaint of mechanical fall and right ankle pain.  Patient states that she was working in her backyard picking up sticks and went to the steps on her back porch, and when walking down she lost her balance and tripped.  Patient states that she had immediate pain to her right ankle.  Denies head or neck injury.  Denies blood thinning medication.  Denies previous significant injury to right ankle or previous right ankle surgeries.  Denies any other injury from fall other than to right ankle. Past medical history significant for hypothyroidism, bunionectomy.  Unfortunately patient states that she currently does not have insurance.    Fall  Ankle Pain      Prior to Admission medications   Medication Sig Start Date End Date Taking? Authorizing Provider  oxyCODONE (ROXICODONE) 5 MG immediate release tablet Take 1 tablet (5 mg total) by mouth every 6 (six) hours as needed for severe pain (pain score 7-10) or breakthrough pain. 06/19/24  Yes Jomaira Darr F, PA-C  benzonatate  (TESSALON ) 100 MG capsule Take 1 capsule (100 mg total) by mouth 3 (three) times daily as needed for cough. 04/15/19   Keith Sor, PA-C  methylPREDNISolone  (MEDROL  DOSEPAK) 4 MG TBPK tablet Use as directed 10/13/21   Harris, Abigail, PA-C  ondansetron  (ZOFRAN -ODT) 4 MG disintegrating tablet Take 1 tablet (4 mg total) by mouth every 8 (eight) hours as needed for nausea or vomiting. 03/31/23   Towana Ozell BROCKS, MD    Allergies: Hydrocodone-acetaminophen     Review of Systems  Musculoskeletal:  Positive for arthralgias (R ankle pain).    Updated Vital Signs BP (!) 165/83 (BP Location: Left Arm)   Pulse 66   Temp 97.7 F (36.5 C) (Oral)   Resp 17    LMP  (LMP Unknown)   SpO2 100%   Physical Exam Vitals and nursing note reviewed.  Constitutional:      General: She is awake. She is not in acute distress.    Appearance: Normal appearance. She is not ill-appearing, toxic-appearing or diaphoretic.  HENT:     Head: Normocephalic and atraumatic.  Eyes:     General: No scleral icterus. Pulmonary:     Effort: Pulmonary effort is normal. No respiratory distress.  Musculoskeletal:     Comments: Significant tenderness to palpation of lateral, medial, and anterior R ankle, very limited flexion and extension of R ankle due to pain, patient is able to move all toes of R foot  RLE neurovascularly intact, DP pulse 2+  Skin:    General: Skin is warm.     Capillary Refill: Capillary refill takes less than 2 seconds.     Comments: Significant swelling and ecchymosis present to R ankle, no open wounds, laceration, or skin tenting noted  Neurological:     General: No focal deficit present.     Mental Status: She is alert and oriented to person, place, and time.  Psychiatric:        Mood and Affect: Mood normal.        Behavior: Behavior normal. Behavior is cooperative.     (all labs ordered are listed, but only abnormal results are displayed) Labs Reviewed - No data to display  EKG: None  Radiology: DG Ankle Complete Right Result Date: 06/19/2024 EXAM: 3 VIEW(S) XRAY OF THE RIGHT ANKLE 06/19/2024 08:52:00 PM CLINICAL HISTORY: Known right ankle fracture, want post-splint films. COMPARISON: Right ankle x-ray 10/26. FINDINGS: BONES AND JOINTS: Trimalleolar ankle fractures are again seen. Alignment has improved and fracture fragments are in near anatomic alignment. No joint dislocation. SOFT TISSUES: There is soft tissue swelling surrounding the ankle. IMPRESSION: 1. Trimalleolar ankle fractures in near anatomic alignment. 2. Soft tissue swelling surrounding the ankle. Electronically signed by: Greig Pique MD 06/19/2024 08:56 PM EDT RP  Workstation: [id number]   DG Ankle Complete Right Result Date: 06/19/2024 EXAM: 3 or more VIEW(S) XRAY OF THE RIGHT ANKLE 06/19/2024 04:56:00 PM CLINICAL HISTORY: deformity. Pt from home with right ankle pain/deformity from fall. COMPARISON: None available. FINDINGS: BONES AND JOINTS: There is an acute transverse fracture through the medial malleolus with fracture fragments distracted 2.5 mm. There is an acute oblique fracture through the distal fibula with fracture fragments distracted up to 2.5 mm. There is an acute posterior malleolar fracture with fragments distracted 2 mm. There is no evidence for dislocation. SOFT TISSUES: There is soft tissue swelling surrounding the ankle. IMPRESSION: 1. Acute trimalleolar ankle fracture. 2. No evidence for dislocation. Electronically signed by: Greig Pique MD 06/19/2024 05:17 PM EDT RP Workstation: [id number]     Procedures   Medications Ordered in the ED  acetaminophen  (TYLENOL ) tablet 650 mg (650 mg Oral Given 06/19/24 1713)  fentaNYL (SUBLIMAZE) injection 50 mcg (50 mcg Intravenous Given 06/19/24 1714)  oxyCODONE (Oxy IR/ROXICODONE) immediate release tablet 5 mg (5 mg Oral Given 06/19/24 1842)  ibuprofen (ADVIL) tablet 800 mg (800 mg Oral Given 06/19/24 1842)  lidocaine (XYLOCAINE) 1 % (with pres) injection (20 mLs  Given 06/19/24 2040)                                    Medical Decision Making Amount and/or Complexity of Data Reviewed Radiology: ordered.  Risk OTC drugs. Prescription drug management.   Patient presents to the ED for concern of mechanical fall, R ankle pain this involves an extensive number of treatment options, and is a complaint that carries with it a high risk of complications and morbidity.  The differential diagnosis includes fracture, dislocation, sprain, laceration, soft tissue injury, etc.    Co morbidities that complicate the patient evaluation  hyperthyroidism   Imaging Studies ordered:  I ordered  imaging studies including Xrays of R ankle I independently visualized and interpreted imaging which showed acute trimalleolar fracture with soft tissue swelling I agree with the radiologist interpretation   Medicines ordered and prescription drug management:  I ordered medication including fentanyl, tylenol , ibuprofen, oxycodone for pain Reevaluation of the patient after these medicines showed that the patient improved I have reviewed the patients home medicines and have made adjustments as needed   Test Considered:  none   Critical Interventions:  none   Consultations Obtained:  I requested consultation with the orthopedic team and discussed lab and imaging findings as well as pertinent plan - they recommend: posterior short leg splint, non weight - bearing, crutches, outpatient follow-up in 1 week   Problem List / ED Course:  53 year old female, vital signs stable, presents to the emergency department with a chief complaint of mechanical fall and right ankle pain On initial physical examination clear injury to right ankle with extensive swelling and ecchymosis present, right lower extremity is neurovascularly intact  including a strong DP pulse, generalized tenderness of right ankle joint including laterally, medially, as well as anteriorly, limited flexion extension of right ankle due to pain, no open wounds noted or breaks in skin Pain control medications given while in the emergency department with improvement in pain X-ray significant for acute trimalleolar fracture, consulted orthopedic surgery team, spoke with Dr. Teresa who was actually at [facility] long for a surgical case.  Dr. Teresa came down to the emergency department and assessed patient, completed a hematoma block and applied posterior right leg splint.  Patient was instructed to remain nonweightbearing and crutches were ordered.  Patient was instructed on pain control outpatient as well as outpatient follow-up with  orthopedics Post-splint xrays obtained, patient tolerated splinting well and and was neurovascularly intact following splint placement Return precautions given Patient discharged Most likely diagnosis at this time is mechanical fall leading to acute trimalleolar fracture of the right ankle, patient placed in splint and given crutches, instructed to remain nonweightbearing and to follow-up with orthopedics in approximately 1 week for further evaluation and possible surgical planning   Reevaluation:  After the interventions noted above, I reevaluated the patient and found that they have :improved   Social Determinants of Health:  No insurance   Dispostion:  After consideration of the diagnostic results and the patients response to treatment, I feel that the patient would benefit from discharge and outpatient therapy as described, follow-up with orthopedics in approximately 1 week      Final diagnoses:  Closed trimalleolar fracture of right ankle, initial encounter    ED Discharge Orders          Ordered    oxyCODONE (ROXICODONE) 5 MG immediate release tablet  Every 6 hours PRN        06/19/24 2102               Janetta Terrall F[place], [place] 06/20/24 1320    Garrick Charleston, MD 06/20/24 1615

## 2024-06-28 ENCOUNTER — Encounter (HOSPITAL_COMMUNITY): Payer: Self-pay

## 2024-06-28 ENCOUNTER — Other Ambulatory Visit: Payer: Self-pay

## 2024-06-28 NOTE — Progress Notes (Addendum)
 For Anesthesia: PCP - N/A  Cardiologist - N/A  Bowel Prep reminder: N/A  Chest x-ray -  greater than 1 year CHL EKG - greater than 1 year CHL Stress Test - N/A ECHO - N/A Cardiac Cath - N/A Pacemaker/ICD device last checked: N/A Pacemaker orders received: N/A Device Rep notified: N/A  Spinal Cord Stimulator:N/A  Sleep Study - N/A CPAP - N/A  Fasting Blood Sugar - N/A Checks Blood Sugar ___N/A__ times a day Date and result of last Hgb A1c-N/A  Last dose of GLP1 agonist- N/A GLP1 instructions: Hold 7 days prior to schedule (Hold 24 hours-daily)  Last dose of SGLT-2 inhibitors- N/A SGLT-2 instructions: Hold 72 hours prior to surgery  Blood Thinner Instructions:N/A Last Dose:N/A Time last taken:N/A  Aspirin Instructions: N/A Last Dose: N/A Time last taken:N/A  Activity level: Able to exercise without chest pain and/or shortness of breath    Anesthesia review: N/A  Patient denies shortness of breath, fever, cough and chest pain at PAT appointment   Patient verbalized understanding of instructions that were reviewed over the telephone.

## 2024-06-28 NOTE — Progress Notes (Signed)
 Sent message, via epic in basket, requesting orders in epic from Careers adviser.

## 2024-06-29 ENCOUNTER — Encounter (HOSPITAL_COMMUNITY): Payer: Self-pay

## 2024-06-29 ENCOUNTER — Observation Stay (HOSPITAL_COMMUNITY): Payer: PRIVATE HEALTH INSURANCE

## 2024-06-29 ENCOUNTER — Ambulatory Visit (HOSPITAL_BASED_OUTPATIENT_CLINIC_OR_DEPARTMENT_OTHER): Payer: PRIVATE HEALTH INSURANCE | Admitting: Registered Nurse

## 2024-06-29 ENCOUNTER — Encounter (HOSPITAL_COMMUNITY): Admission: RE | Disposition: A | Payer: Self-pay | Source: Home / Self Care

## 2024-06-29 ENCOUNTER — Ambulatory Visit (HOSPITAL_COMMUNITY): Payer: PRIVATE HEALTH INSURANCE | Admitting: Registered Nurse

## 2024-06-29 ENCOUNTER — Observation Stay (HOSPITAL_COMMUNITY)
Admission: RE | Admit: 2024-06-29 | Discharge: 2024-07-01 | Disposition: A | Discharge status: Home / Self Care | Payer: PRIVATE HEALTH INSURANCE

## 2024-06-29 DIAGNOSIS — X58XXXA Exposure to other specified factors, initial encounter: Secondary | ICD-10-CM | POA: Insufficient documentation

## 2024-06-29 DIAGNOSIS — I10 Essential (primary) hypertension: Secondary | ICD-10-CM | POA: Diagnosis not present

## 2024-06-29 DIAGNOSIS — Z87891 Personal history of nicotine dependence: Secondary | ICD-10-CM | POA: Diagnosis not present

## 2024-06-29 DIAGNOSIS — S82851A Displaced trimalleolar fracture of right lower leg, initial encounter for closed fracture: Secondary | ICD-10-CM

## 2024-06-29 HISTORY — PX: ORIF ANKLE FRACTURE: SHX5408

## 2024-06-29 HISTORY — DX: Other seasonal allergic rhinitis: J30.2

## 2024-06-29 HISTORY — DX: Unspecified osteoarthritis, unspecified site: M19.90

## 2024-06-29 HISTORY — DX: Essential (primary) hypertension: I10

## 2024-06-29 LAB — BASIC METABOLIC PANEL WITH GFR
Anion gap: 9 (ref 5–15)
BUN: 12 mg/dL (ref 6–20)
CO2: 29 mmol/L (ref 22–32)
Calcium: 9.7 mg/dL (ref 8.9–10.3)
Chloride: 101 mmol/L (ref 98–111)
Creatinine, Ser: 0.62 mg/dL (ref 0.44–1.00)
GFR, Estimated: >60 mL/min (ref >60)
Glucose, Bld: 113 mg/dL — ABNORMAL HIGH (ref 70–99)
Potassium: 3.8 mmol/L (ref 3.5–5.1)
Sodium: 139 mmol/L (ref 135–145)

## 2024-06-29 SURGERY — OPEN REDUCTION INTERNAL FIXATION (ORIF) ANKLE FRACTURE
Anesthesia: Regional | Site: Ankle | Laterality: Right

## 2024-06-29 MED ORDER — EPHEDRINE SULFATE (PRESSORS) 25 MG/5ML IV SOSY
PREFILLED_SYRINGE | INTRAVENOUS | Status: DC | PRN
  Administered 2024-06-29: 5 mg via INTRAVENOUS

## 2024-06-29 MED ORDER — FENTANYL CITRATE (PF) 100 MCG/2ML IJ SOLN
INTRAMUSCULAR | Status: DC | PRN
  Administered 2024-06-29 (×2): 25 ug via INTRAVENOUS
  Administered 2024-06-29: 50 ug via INTRAVENOUS

## 2024-06-29 MED ORDER — ACETAMINOPHEN 500 MG PO TABS
1000.0000 mg | ORAL_TABLET | Freq: Once | ORAL | Status: AC
  Administered 2024-06-29: 1000 mg via ORAL
  Filled 2024-06-29: qty 2

## 2024-06-29 MED ORDER — FENTANYL CITRATE (PF) 100 MCG/2ML IJ SOLN
INTRAMUSCULAR | Status: AC
  Filled 2024-06-29: qty 2

## 2024-06-29 MED ORDER — SENNOSIDES-DOCUSATE SODIUM 8.6-50 MG PO TABS: 1.0000 | ORAL_TABLET | Freq: Every day | ORAL | 0 refills | Status: AC

## 2024-06-29 MED ORDER — ORAL CARE MOUTH RINSE: 15.0000 mL | Freq: Once | OROMUCOSAL | Status: AC

## 2024-06-29 MED ORDER — FENTANYL CITRATE (PF) 50 MCG/ML IJ SOSY
PREFILLED_SYRINGE | INTRAMUSCULAR | Status: AC
  Filled 2024-06-29: qty 2

## 2024-06-29 MED ORDER — ACETAMINOPHEN 10 MG/ML IV SOLN
INTRAVENOUS | Status: AC
  Filled 2024-06-29: qty 100

## 2024-06-29 MED ORDER — STERILE WATER FOR IRRIGATION IR SOLN
Status: DC | PRN
  Administered 2024-06-29: 1000 mL

## 2024-06-29 MED ORDER — CEFAZOLIN SODIUM-DEXTROSE 2-4 GM/100ML-% IV SOLN
2.0000 g | INTRAVENOUS | Status: AC
  Administered 2024-06-29: 2 g via INTRAVENOUS
  Filled 2024-06-29: qty 100

## 2024-06-29 MED ORDER — CEFAZOLIN SODIUM-DEXTROSE 1-4 GM/50ML-% IV SOLN
1.0000 g | Freq: Three times a day (TID) | INTRAVENOUS | Status: AC
  Administered 2024-06-30 (×2): 1 g via INTRAVENOUS
  Filled 2024-06-29 (×2): qty 50

## 2024-06-29 MED ORDER — FENTANYL CITRATE (PF) 50 MCG/ML IJ SOSY
50.0000 ug | PREFILLED_SYRINGE | INTRAMUSCULAR | Status: DC
  Administered 2024-06-29: 100 ug via INTRAVENOUS

## 2024-06-29 MED ORDER — ACETAMINOPHEN 500 MG PO TABS: 1000.0000 mg | ORAL_TABLET | Freq: Once | ORAL | Status: DC

## 2024-06-29 MED ORDER — OXYCODONE HCL 5 MG/5ML PO SOLN: 5.0000 mg | Freq: Once | ORAL | Status: DC | PRN

## 2024-06-29 MED ORDER — ONDANSETRON HCL 4 MG/2ML IJ SOLN
INTRAMUSCULAR | Status: DC | PRN
  Administered 2024-06-29: 4 mg via INTRAVENOUS

## 2024-06-29 MED ORDER — ACETAMINOPHEN 325 MG PO TABS: 975.0000 mg | ORAL_TABLET | Freq: Three times a day (TID) | ORAL | 2 refills | Status: AC

## 2024-06-29 MED ORDER — PHENYLEPHRINE HCL (PRESSORS) 10 MG/ML IV SOLN
INTRAVENOUS | Status: DC | PRN
  Administered 2024-06-29 (×3): 80 ug via INTRAVENOUS

## 2024-06-29 MED ORDER — ASPIRIN 81 MG PO CHEW
81.0000 mg | CHEWABLE_TABLET | Freq: Two times a day (BID) | ORAL | Status: DC
  Administered 2024-06-30 – 2024-07-01 (×3): 81 mg via ORAL
  Filled 2024-06-29 (×3): qty 1

## 2024-06-29 MED ORDER — MIDAZOLAM HCL 2 MG/2ML IJ SOLN
INTRAMUSCULAR | Status: AC
  Filled 2024-06-29: qty 2

## 2024-06-29 MED ORDER — LIDOCAINE HCL (CARDIAC) PF 100 MG/5ML IV SOSY
PREFILLED_SYRINGE | INTRAVENOUS | Status: DC | PRN
  Administered 2024-06-29: 60 mg via INTRAVENOUS

## 2024-06-29 MED ORDER — ONDANSETRON HCL 4 MG/2ML IJ SOLN: 4.0000 mg | Freq: Once | INTRAMUSCULAR | Status: DC | PRN

## 2024-06-29 MED ORDER — KETOROLAC TROMETHAMINE 30 MG/ML IJ SOLN: 30.0000 mg | Freq: Once | INTRAMUSCULAR | Status: DC | PRN

## 2024-06-29 MED ORDER — OXYCODONE HCL 5 MG PO TABS: 5.0000 mg | ORAL_TABLET | Freq: Once | ORAL | Status: DC | PRN

## 2024-06-29 MED ORDER — CHLORHEXIDINE GLUCONATE 0.12 % MT SOLN
15.0000 mL | Freq: Once | OROMUCOSAL | Status: AC
  Administered 2024-06-29: 15 mL via OROMUCOSAL

## 2024-06-29 MED ORDER — OXYCODONE HCL 5 MG PO TABS
5.0000 mg | ORAL_TABLET | ORAL | Status: DC | PRN
  Administered 2024-06-30: 5 mg via ORAL
  Administered 2024-06-30: 10 mg via ORAL
  Filled 2024-06-29: qty 1

## 2024-06-29 MED ORDER — BUPIVACAINE LIPOSOME 1.3 % IJ SUSP
INTRAMUSCULAR | Status: DC | PRN
  Administered 2024-06-29: 20 mL via PERINEURAL

## 2024-06-29 MED ORDER — ONDANSETRON HCL 4 MG/2ML IJ SOLN: 4.0000 mg | Freq: Four times a day (QID) | INTRAMUSCULAR | Status: DC | PRN

## 2024-06-29 MED ORDER — DEXAMETHASONE SODIUM PHOSPHATE 4 MG/ML IJ SOLN
INTRAMUSCULAR | Status: DC | PRN
  Administered 2024-06-29: 10 mg via INTRAVENOUS

## 2024-06-29 MED ORDER — ACETAMINOPHEN 10 MG/ML IV SOLN
INTRAVENOUS | Status: DC | PRN
  Administered 2024-06-29: 1000 mg via INTRAVENOUS

## 2024-06-29 MED ORDER — METHOCARBAMOL 1000 MG/10ML IJ SOLN: 500.0000 mg | Freq: Four times a day (QID) | INTRAMUSCULAR | Status: DC | PRN

## 2024-06-29 MED ORDER — OXYCODONE HCL 5 MG PO TABS: 5.0000 mg | ORAL_TABLET | Freq: Four times a day (QID) | ORAL | 0 refills | Status: AC | PRN

## 2024-06-29 MED ORDER — DOCUSATE SODIUM 100 MG PO CAPS
100.0000 mg | ORAL_CAPSULE | Freq: Two times a day (BID) | ORAL | Status: DC
  Administered 2024-06-29 – 2024-07-01 (×4): 100 mg via ORAL
  Filled 2024-06-29 (×4): qty 1

## 2024-06-29 MED ORDER — OXYCODONE HCL 5 MG PO TABS
10.0000 mg | ORAL_TABLET | ORAL | Status: DC | PRN
  Administered 2024-06-30 – 2024-07-01 (×5): 10 mg via ORAL
  Filled 2024-06-29 (×6): qty 2

## 2024-06-29 MED ORDER — ROPIVACAINE HCL 5 MG/ML IJ SOLN
INTRAMUSCULAR | Status: DC | PRN
  Administered 2024-06-29: 40 mL via PERINEURAL

## 2024-06-29 MED ORDER — MEPERIDINE HCL 100 MG/ML IJ SOLN: 6.2500 mg | INTRAMUSCULAR | Status: DC | PRN

## 2024-06-29 MED ORDER — 0.9 % SODIUM CHLORIDE (POUR BTL) OPTIME
TOPICAL | Status: DC | PRN
  Administered 2024-06-29: 1000 mL

## 2024-06-29 MED ORDER — LACTATED RINGERS IV SOLN: INTRAVENOUS | Status: DC

## 2024-06-29 MED ORDER — METHOCARBAMOL 500 MG PO TABS
500.0000 mg | ORAL_TABLET | Freq: Four times a day (QID) | ORAL | Status: DC | PRN
  Administered 2024-06-29 – 2024-07-01 (×5): 500 mg via ORAL
  Filled 2024-06-29 (×5): qty 1

## 2024-06-29 MED ORDER — ONDANSETRON HCL 4 MG PO TABS
4.0000 mg | ORAL_TABLET | Freq: Four times a day (QID) | ORAL | Status: DC | PRN
Start: 2024-06-29 — End: 2024-07-01

## 2024-06-29 MED ORDER — MIDAZOLAM HCL (PF) 2 MG/2ML IJ SOLN
1.0000 mg | INTRAMUSCULAR | Status: DC
  Administered 2024-06-29: 2 mg via INTRAVENOUS

## 2024-06-29 MED ORDER — AMISULPRIDE (ANTIEMETIC) 5 MG/2ML IV SOLN: 10.0000 mg | Freq: Once | INTRAVENOUS | Status: DC | PRN

## 2024-06-29 MED ORDER — TRANEXAMIC ACID-NACL 1000-0.7 MG/100ML-% IV SOLN
1000.0000 mg | INTRAVENOUS | Status: AC
  Administered 2024-06-29: 1000 mg via INTRAVENOUS
  Filled 2024-06-29: qty 100

## 2024-06-29 MED ORDER — ACETAMINOPHEN 500 MG PO TABS
500.0000 mg | ORAL_TABLET | Freq: Four times a day (QID) | ORAL | Status: AC
  Administered 2024-06-30 (×3): 500 mg via ORAL
  Filled 2024-06-29 (×4): qty 1

## 2024-06-29 MED ORDER — PROPOFOL 10 MG/ML IV BOLUS
INTRAVENOUS | Status: AC
  Filled 2024-06-29: qty 20

## 2024-06-29 MED ORDER — PROPOFOL 10 MG/ML IV BOLUS
INTRAVENOUS | Status: DC | PRN
Start: 2024-06-29 — End: 2024-06-29
  Administered 2024-06-29: 200 mg via INTRAVENOUS

## 2024-06-29 MED ORDER — SENNA 8.6 MG PO TABS
1.0000 | ORAL_TABLET | Freq: Two times a day (BID) | ORAL | Status: DC
  Administered 2024-06-29 – 2024-07-01 (×4): 8.6 mg via ORAL
  Filled 2024-06-29 (×4): qty 1

## 2024-06-29 MED ORDER — HYDROMORPHONE HCL 1 MG/ML IJ SOLN: 0.2500 mg | INTRAMUSCULAR | Status: DC | PRN

## 2024-06-29 SURGICAL SUPPLY — 57 items
BAG COUNTER SPONGE SURGICOUNT (BAG) IMPLANT
BAG ZIPLOCK 12X15 (MISCELLANEOUS) ×1 IMPLANT
BIT DRILL 2.5 CANN LNG (BIT) IMPLANT
BIT DRILL 2.5 CANN STRL (BIT) IMPLANT
BIT DRILL 2.6 CANN (BIT) IMPLANT
BIT DRILL 3.5 CANN STRL (BIT) IMPLANT
BNDG COHESIVE 4X5 TAN STRL LF (GAUZE/BANDAGES/DRESSINGS) ×1 IMPLANT
BNDG ELASTIC 4INX 5YD STR LF (GAUZE/BANDAGES/DRESSINGS) ×1 IMPLANT
BNDG ELASTIC 4X5.8 VLCR NS LF (GAUZE/BANDAGES/DRESSINGS) IMPLANT
BNDG ELASTIC 6INX 5YD STR LF (GAUZE/BANDAGES/DRESSINGS) ×1 IMPLANT
BNDG GAUZE DERMACEA FLUFF 4 (GAUZE/BANDAGES/DRESSINGS) IMPLANT
COVER SURGICAL LIGHT HANDLE (MISCELLANEOUS) ×1 IMPLANT
CUFF TRNQT CYL 34X4.125X (TOURNIQUET CUFF) ×1 IMPLANT
DRAPE C-ARM 42X120 X-RAY (DRAPES) ×1 IMPLANT
DRAPE C-ARMOR (DRAPES) ×1 IMPLANT
DRAPE U-SHAPE 47X51 STRL (DRAPES) ×1 IMPLANT
DRSG ADAPTIC 3X8 NADH LF (GAUZE/BANDAGES/DRESSINGS) ×1 IMPLANT
DURAPREP 26ML APPLICATOR (WOUND CARE) ×1 IMPLANT
ELECT COATED BLADE 2.86 ST (ELECTRODE) IMPLANT
ELECT PENCIL ROCKER SW 15FT (MISCELLANEOUS) ×1 IMPLANT
ELECT REM PT RETURN 15FT ADLT (MISCELLANEOUS) ×1 IMPLANT
GAUZE PAD ABD 8X10 STRL (GAUZE/BANDAGES/DRESSINGS) ×2 IMPLANT
GAUZE SPONGE 4X4 12PLY STRL (GAUZE/BANDAGES/DRESSINGS) ×2 IMPLANT
GAUZE XEROFORM 5X9 LF (GAUZE/BANDAGES/DRESSINGS) IMPLANT
GLOVE BIOGEL PI IND STRL 7.0 (GLOVE) IMPLANT
GLOVE BIOGEL PI IND STRL 8 (GLOVE) ×2 IMPLANT
GLOVE ECLIPSE 7.0 STRL STRAW (GLOVE) IMPLANT
GLOVE SS BIOGEL STRL SZ 8 (GLOVE) ×3 IMPLANT
GOWN STRL REUS W/ TWL XL LVL3 (GOWN DISPOSABLE) ×1 IMPLANT
GUIDEWIRE 1.35MM (WIRE) IMPLANT
KIT BASIN OR (CUSTOM PROCEDURE TRAY) ×1 IMPLANT
KIT TURNOVER KIT A (KITS) ×1 IMPLANT
KWIRE BB-TAK (WIRE) IMPLANT
MANIFOLD NEPTUNE II (INSTRUMENTS) ×1 IMPLANT
PACK ORTHO EXTREMITY (CUSTOM PROCEDURE TRAY) ×1 IMPLANT
PAD CAST 4YDX4 CTTN HI CHSV (CAST SUPPLIES) ×2 IMPLANT
PADDING CAST ABS COTTON 4X4 ST (CAST SUPPLIES) IMPLANT
PADDING CAST COTTON 6X4 STRL (CAST SUPPLIES) IMPLANT
PENCIL SMOKE EVACUATOR (MISCELLANEOUS) IMPLANT
PLATE LOCK THIRD TI 7H TUBULAR (Plate) IMPLANT
PROTECTOR NERVE ULNAR (MISCELLANEOUS) ×1 IMPLANT
SCREW CANC TI FT ANKLE 4X14 (Screw) IMPLANT
SCREW CORT FT 3.5X22 (Screw) IMPLANT
SCREW CORT FT LP 3.5X10 (Screw) IMPLANT
SCREW CORT TI FT ANKLE 3.5X20 (Screw) IMPLANT
SCREW LP 3.5X12MM (Screw) IMPLANT
SCREW QCKFIX CANN 4.0X40MM (Screw) IMPLANT
SPLINT PLASTER CAST XFAST 5X30 (CAST SUPPLIES) IMPLANT
STRAP ANKLE DISTRACTOR (MISCELLANEOUS) ×1 IMPLANT
SUCTION TUBE FRAZIER 12FR DISP (SUCTIONS) IMPLANT
SUT ETHILON 3 0 PS 1 (SUTURE) ×3 IMPLANT
SUT MNCRL AB 4-0 PS2 18 (SUTURE) IMPLANT
SUT VIC AB 0 CT1 36 (SUTURE) ×1 IMPLANT
SUT VIC AB 1 CT1 36 (SUTURE) ×1 IMPLANT
SUT VIC AB 2-0 CT1 TAPERPNT 27 (SUTURE) ×1 IMPLANT
TOWEL OR 17X26 10 PK STRL BLUE (TOWEL DISPOSABLE) ×1 IMPLANT
YANKAUER SUCT BULB TIP 10FT TU (MISCELLANEOUS) IMPLANT

## 2024-06-29 NOTE — H&P (Signed)
 ORTHOPAEDIC H&P  REQUESTING PHYSICIAN: Teresa Rankin ORN, MD  PCP:  Pcp, No  Chief Complaint: Right trimalleolar ankle fracture  HPI: Brianna Dixon is a 53 y.o. female who has a right unstable trimalleolar ankle fracture.  She presents today for surgical fixation.  She has had no changes to her medical history.  She has been NPO since midnight.  Past Medical History:  Diagnosis Date   Arthritis    Bunion   Hypertension    Multinodular goiter    Seasonal allergies    Past Surgical History:  Procedure Laterality Date   BUNIONECTOMY Right    CESAREAN SECTION     Social History   Socioeconomic History   Marital status: Married    Spouse name: Not on file   Number of children: Not on file   Years of education: Not on file   Highest education level: Not on file  Occupational History   Not on file  Tobacco Use   Smoking status: Former    Types: Cigarettes   Smokeless tobacco: Never  Vaping Use   Vaping status: Never Used  Substance and Sexual Activity   Alcohol use: Never   Drug use: Never   Sexual activity: Not on file  Other Topics Concern   Not on file  Social History Narrative   Not on file   Social Drivers of Health   Financial Resource Strain: Not on file  Food Insecurity: No Food Insecurity (06/15/2024)   Received from [facility]   Hunger Vital Sign    Within the past 12 months, you worried that your food would run out before you got the money to buy more.: Never true    Within the past 12 months, the food you bought just didn't last and you didn't have money to get more.: Never true  Transportation Needs: No Transportation Needs (06/15/2024)   Received from [employer] ([facility])   PRAPARE - Transportation    In the past 12 months, has lack of transportation kept you from medical appointments or from getting medications?: No    In the past 12 months, has lack of transportation kept you from meetings, work, or from  getting things needed for daily living?: No  Physical Activity: Insufficiently Active (06/15/2024)   Received from [employer] ([facility])   Exercise Vital Sign    On average, how many days per week do you engage in moderate to strenuous exercise (like a brisk walk)?: 4 days    On average, how many minutes do you engage in exercise at this level?: 20 min  Stress: Not on file  Social Connections: Moderately Isolated (06/15/2024)   Received from [employer] ([facility])   Social Connection and Isolation Panel    In a typical week, how many times do you talk on the phone with family, friends, or neighbors?: More than three times a week    How often do you get together with friends or relatives?: More than three times a week    How often do you attend church or religious services?: 1 to 4 times per year    Do you belong to any clubs or organizations such as church groups, unions, fraternal or athletic groups, or school groups?: No    How often do you attend meetings of the clubs or organizations you belong to?: Never    Are you married, widowed, divorced, separated, never married, or living with a partner?: Separated   Family History  Problem Relation Age of Onset   Hypothyroidism Son    Allergies  Allergen Reactions   Hydrocodone-Acetaminophen  Nausea And Vomiting    Dizzy and very sleepy  (hard to wake up)   Prior to Admission medications   Medication Sig Start Date End Date Taking? Authorizing Provider  acetaminophen  (TYLENOL ) 325 MG tablet Take 650 mg by mouth every 6 (six) hours as needed for moderate pain (pain score 4-6).   Yes [provider]  busPIRone (BUSPAR) 5 MG tablet Take 5 mg by mouth 2 (two) times daily.   Yes [provider]  propranolol (INDERAL) 60 MG tablet Take 60 mg by mouth every evening.   Yes [provider]  benzonatate  (TESSALON ) 100 MG capsule Take 1 capsule (100 mg total) by mouth 3 (three) times daily as needed for  cough. Patient not taking: Reported on 06/28/2024 04/15/19   Keith Sor, PA-C  methylPREDNISolone  (MEDROL  DOSEPAK) 4 MG TBPK tablet Use as directed Patient not taking: Reported on 06/28/2024 10/13/21   Harris, Abigail, PA-C  ondansetron  (ZOFRAN -ODT) 4 MG disintegrating tablet Take 1 tablet (4 mg total) by mouth every 8 (eight) hours as needed for nausea or vomiting. Patient not taking: Reported on 06/28/2024 03/31/23   Butler, Michael C, MD  oxyCODONE (ROXICODONE) 5 MG immediate release tablet Take 1 tablet (5 mg total) by mouth every 6 (six) hours as needed for severe pain (pain score 7-10) or breakthrough pain. 06/19/24   Hinnant, Collin F, PA-C   No results found.  Positive ROS: All other systems have been reviewed and were otherwise negative with the exception of those mentioned in the HPI and as above.  Physical Exam: General: Alert, no acute distress Cardiovascular: No pedal edema Respiratory: No cyanosis, no use of accessory musculature GI: No organomegaly, abdomen is soft and non-tender Skin: No lesions in the area of chief complaint Neurologic: Sensation intact distally Psychiatric: Patient is competent for consent with normal mood and affect Lymphatic: No axillary or cervical lymphadenopathy  MUSCULOSKELETAL:   Right lower extremity - splint intact - able to wiggle toes - sensation intact to light touch over exposed toes - cap refill < 2 sec  Assessment: Right trimalleolar ankle fracture  Plan: Patient is indicated to undergo ORIF right trimalleolar fracture to confer stability to the fracture and confer congruence of the articular surface. Informed consent obtained  Proceed with ORIF right trimalleolar ankle fracture Oxy to be prescribed today Informed consent obtained 2 g ancef 1 g TXA Stool softener post op 6 weeks 81 mg ASA BID post op Return to clinic 2 weeks post op for skin check and repeat x-rays    Rankin LELON Pizza, MD    06/29/2024 12:01 PM

## 2024-06-29 NOTE — Plan of Care (Signed)

## 2024-06-29 NOTE — Anesthesia Procedure Notes (Signed)
 Procedure Name: LMA Insertion Date/Time: 06/29/2024 7:30 PM  Performed by: Cena Epps, CRNAPre-anesthesia Checklist: Patient identified, Emergency Drugs available, Suction available and Patient being monitored Patient Re-evaluated:Patient Re-evaluated prior to induction Oxygen Delivery Method: Circle System Utilized Preoxygenation: Pre-oxygenation with 100% oxygen Induction Type: IV induction Ventilation: Mask ventilation without difficulty LMA: LMA inserted LMA Size: 4.0 Number of attempts: 1 Airway Equipment and Method: Bite block Placement Confirmation: positive ETCO2 Tube secured with: Tape Dental Injury: Teeth and Oropharynx as per pre-operative assessment

## 2024-06-29 NOTE — Anesthesia Procedure Notes (Signed)
 Anesthesia Regional Block: Adductor canal block   Pre-Anesthetic Checklist: , timeout performed,  Correct Patient, Correct Site, Correct Laterality,  Correct Procedure, Correct Position, site marked,  Risks and benefits discussed,  Surgical consent,  Pre-op evaluation,  At surgeon's request and post-op pain management  Laterality: Right  Prep: Maximum Sterile Barrier Precautions used, chloraprep       Needles:  Injection technique: Single-shot  Needle Type: Echogenic Stimulator Needle     Needle Length: 9cm  Needle Gauge: 22     Additional Needles:   Procedures:,,,, ultrasound used (permanent image in chart),,    Narrative:  Start time: 06/29/2024 3:05 PM End time: 06/29/2024 3:10 PM Injection made incrementally with aspirations every 5 mL.  Performed by: Personally  Anesthesiologist: Merla Almarie HERO, DO  Additional Notes: Monitors applied. No increased pain on injection. No increased resistance to injection. Injection made in 5cc increments. Good needle visualization. Patient tolerated procedure well.

## 2024-06-29 NOTE — Anesthesia Postprocedure Evaluation (Signed)
 Anesthesia Post Note  Patient: Brianna Dixon  Procedure(s) Performed: OPEN REDUCTION INTERNAL FIXATION (ORIF) TRIMALLEOLAR ANKLE FRACTURE (Right: Ankle)     Patient location during evaluation: PACU Anesthesia Type: Regional and General Level of consciousness: awake and alert, oriented and patient cooperative Pain management: pain level controlled Vital Signs Assessment: post-procedure vital signs reviewed and stable Respiratory status: spontaneous breathing, nonlabored ventilation and respiratory function stable Cardiovascular status: blood pressure returned to baseline and stable Postop Assessment: no apparent nausea or vomiting Anesthetic complications: no   No notable events documented.  Last Vitals:  Vitals:   06/29/24 2226 06/29/24 2230  BP: 122/80 128/76  Pulse: 87 88  Resp: 13 18  Temp: 36.5 C   SpO2: 98% 95%    Last Pain:  Vitals:   06/29/24 2230  TempSrc:   PainSc: 0-No pain                 Almarie CHRISTELLA Marchi

## 2024-06-29 NOTE — Op Note (Signed)
 Date of Surgery: 06/29/2024  INDICATIONS: Brianna Dixon is a 53 y.o.-year-old female who sustained a {Side:17767} ankle fracture; she was examined pre operatively and was demonstrated to have *** 2+ DP pulse, intact sensation to superficial peroneal, deep peroneal, tibial, sural and saphenous nerve distributions and able to flex and extend the toes of the operative extremity. she was indicated for open reduction and internal fixation due to ***. Informed consent with discussion of risks, benefits and alternatives was obtained and all questions were answered to the satisfaction of the  {Patient/Family/Caregiver:109081::patient}. The {Patient/Family/Caregiver:109081::patient} elected to proceed with ORIF of the {Side:17767} ankle fracture  PREOPERATIVE DIAGNOSIS: {Dpiz:82232} *** ankle fracture  POSTOPERATIVE DIAGNOSIS: Same.  PROCEDURE: Open treatment of {Side:17767} ankle fracture with internal fixation. Lateral malleolar CPT [id number]; Medial malleolar CPT [id number] ; Bimalleolar CPT [id number]; Trimalleolar w/o fixation of posterior malleolus CPT 27822. Trimalleolar w/ fixation of posterior malleolus CPT 27823.  SURGEON: Rankin Pizza, M.D.  ASSIST: ***.  ANESTHESIA:  {Opnote anesthesia:14831::general}  TOURNIQUET TIME: ***  IV FLUIDS AND URINE: See anesthesia.  ESTIMATED BLOOD LOSS: *** mL.  IMPLANTS: Arthrex ***  COMPLICATIONS: None.  DESCRIPTION OF PROCEDURE: The patient was brought to the operating room and placed supine on the operating table.  The patient had been signed prior to the procedure and this was documented. The patient had the anesthesia placed by the anesthesiologist.  A nonsterile tourniquet was placed on the upper thigh.  The prep verification and incision time-outs were performed to confirm that this was the correct patient, site, side and location. The patient had an SCD on the opposite lower extremity. The patient did receive antibiotics prior to the incision and was  re-dosed during the procedure as needed at indicated intervals.  The patient had the lower extremity prepped and draped in the standard surgical fashion.  The extremity was exsanguinated using an esmarch bandage and the tourniquet was inflated to 300 mm Hg.   Incision was made over the distal fibula with care to identify and protect the superficial peroneal nerve which was *** observed approximately *** cm from the distal tip of the fibula.  The fracture was exposed and reduced anatomically with a clamp. C-arm was then utilized to confirm adequacy of the reduction. Satisfied with reduction quality, a lag screw was placed. I then applied a 1/3 tubular locking plate and secured it proximally and distally with non-locking screws. Bone quality was ***.   I then turned my attention to the medial malleolus. Incision was made over the medial malleolus and the fracture exposed and held provisionally with a clamp. 2 guidepins were placed for the 4.0 mm cannulated screws and then confirmation of reduction was made with fluoroscopy. I then placed 2  40mm screws which had satisfactory fixation.   The syndesmosis was stressed using live fluoroscopy and found to be stable.   The wounds were irrigated, and closed with 2-0 vicryl for the subcuticular layer, 3-0 nylon for the skin in vertical mattress fashion. The wounds were injected with local anesthetic. Wounds were dressed with xeroform, sterile gauze, ABD followed by a posterior splint. She was awakened per anesthesia protocol and returned to the PACU in stable and satisfactory condition. There were no complications.  ***  POSTOPERATIVE PLAN: Brianna Dixon will remain nonweightbearing on this leg for approximately 4 weeks; Brianna Dixon will return for suture removal in 2 weeks.  He will be immobilized in a short leg splint and then transitioned to a CAM walker at his first follow up appointment.  Brianna Dixon will receive DVT prophylaxis based on other medications,  activity level, and risk ratio of bleeding to thrombosis.  Rankin Pizza, MD [facility] [place] 12:40 PM

## 2024-06-29 NOTE — Anesthesia Preprocedure Evaluation (Addendum)
 Anesthesia Evaluation  Patient identified by MRN, date of birth, ID band Patient awake    Reviewed: Allergy & Precautions, NPO status , Patient's Chart, lab work & pertinent test results  Airway Mallampati: IV  TM Distance: >3 FB Neck ROM: Full    Dental  (+) Teeth Intact, Dental Advisory Given   Pulmonary former smoker   Pulmonary exam normal breath sounds clear to auscultation       Cardiovascular hypertension (147/83 preop, started propanolol a few weeks ago), Pt. on medications and Pt. on home beta blockers Normal cardiovascular exam Rhythm:Regular Rate:Normal     Neuro/Psych negative neurological ROS  negative psych ROS   GI/Hepatic negative GI ROS, Neg liver ROS,,,  Endo/Other  negative endocrine ROS    Renal/GU negative Renal ROS  negative genitourinary   Musculoskeletal  (+) Arthritis , Osteoarthritis,    Abdominal  (+) + obese  Peds  Hematology negative hematology ROS (+)   Anesthesia Other Findings Took propanolol and buspar this AM  Reproductive/Obstetrics negative OB ROS                              Anesthesia Physical Anesthesia Plan  ASA: 2  Anesthesia Plan: General and Regional   Post-op Pain Management: Regional block*, Tylenol  PO (pre-op)* and Toradol  IV (intra-op)*   Induction: Intravenous  PONV Risk Score and Plan: 3 and Ondansetron , Dexamethasone , Midazolam and Treatment may vary due to age or medical condition  Airway Management Planned: LMA  Additional Equipment: None  Intra-op Plan:   Post-operative Plan: Extubation in OR  Informed Consent: I have reviewed the patients History and Physical, chart, labs and discussed the procedure including the risks, benefits and alternatives for the proposed anesthesia with the patient or authorized representative who has indicated his/her understanding and acceptance.     Dental advisory given  Plan Discussed  with: CRNA  Anesthesia Plan Comments:          Anesthesia Quick Evaluation

## 2024-06-29 NOTE — Brief Op Note (Signed)
 06/29/2024  11:11 PM  PATIENT:  Brianna Dixon  53 y.o. female  PRE-OPERATIVE DIAGNOSIS:  RIGHT TRIMALLEOLAR ANKLE FRACTURE  POST-OPERATIVE DIAGNOSIS:  RIGHT TRIMALLEOLAR ANKLE FRACTURE  PROCEDURE:  Procedure(s) with comments: OPEN REDUCTION INTERNAL FIXATION (ORIF) TRIMALLEOLAR ANKLE FRACTURE (Right) - open reduction intranal fixation right ankle trimalleolar  SURGEON:  Surgeons and Role:    * Bashar Milam, Rankin ORN, MD - Primary  PHYSICIAN ASSISTANT:   ASSISTANTS: none   ANESTHESIA:   regional and general  EBL:  25 mL   BLOOD ADMINISTERED:none  DRAINS: none   LOCAL MEDICATIONS USED:  NONE  SPECIMEN:  No Specimen  DISPOSITION OF SPECIMEN:  N/A  COUNTS:  YES  TOURNIQUET:   Total Tourniquet Time Documented: Thigh (Right) - 120 minutes Total: Thigh (Right) - 120 minutes   DICTATION: .Note written in EPIC  PLAN OF CARE: Admit for overnight observation  PATIENT DISPOSITION:  PACU - hemodynamically stable.   Delay start of Pharmacological VTE agent (>24hrs) due to surgical blood loss or risk of bleeding: no

## 2024-06-29 NOTE — Anesthesia Procedure Notes (Addendum)
 Anesthesia Regional Block: Popliteal block   Pre-Anesthetic Checklist: , timeout performed,  Correct Patient, Correct Site, Correct Laterality,  Correct Procedure, Correct Position, site marked,  Risks and benefits discussed,  Surgical consent,  Pre-op evaluation,  At surgeon's request and post-op pain management  Laterality: Right  Prep: Maximum Sterile Barrier Precautions used, chloraprep       Needles:  Injection technique: Single-shot  Needle Type: Echogenic Stimulator Needle     Needle Length: 9cm  Needle Gauge: 22     Additional Needles:   Procedures:,,,, ultrasound used (permanent image in chart),,    Narrative:  Start time: 06/29/2024 3:00 PM End time: 06/29/2024 3:05 PM Injection made incrementally with aspirations every 5 mL.  Performed by: Personally  Anesthesiologist: Merla Almarie HERO, DO  Additional Notes: Monitors applied. No increased pain on injection. No increased resistance to injection. Injection made in 5cc increments. Good needle visualization. Patient tolerated procedure well.

## 2024-06-29 NOTE — Transfer of Care (Signed)
 Immediate Anesthesia Transfer of Care Note  Patient: Brianna Dixon  Procedure(s) Performed: OPEN REDUCTION INTERNAL FIXATION (ORIF) TRIMALLEOLAR ANKLE FRACTURE (Right: Ankle)  Patient Location: PACU  Anesthesia Type:General and Regional  Level of Consciousness: drowsy  Airway & Oxygen Therapy: Patient Spontanous Breathing and Patient connected to face mask oxygen  Post-op Assessment: Report given to RN and Post -op Vital signs reviewed and stable  Post vital signs: Reviewed and stable  Last Vitals:  Vitals Value Taken Time  BP 122/80 06/29/24 22:26  Temp    Pulse 87 06/29/24 22:27  Resp 13 06/29/24 22:27  SpO2 98 % 06/29/24 22:27  Vitals shown include unfiled device data.  Last Pain:  Vitals:   06/29/24 1520  TempSrc:   PainSc: 0-No pain      Patients Stated Pain Goal: 3 (06/28/24 1256)  Complications: No notable events documented.

## 2024-06-30 ENCOUNTER — Encounter (HOSPITAL_COMMUNITY): Payer: Self-pay

## 2024-06-30 DIAGNOSIS — S82851A Displaced trimalleolar fracture of right lower leg, initial encounter for closed fracture: Secondary | ICD-10-CM | POA: Diagnosis not present

## 2024-06-30 LAB — CBC
HCT: 38.1 % (ref 36.0–46.0)
Hemoglobin: 12.5 g/dL (ref 12.0–15.0)
MCH: 30.4 pg (ref 26.0–34.0)
MCHC: 32.8 g/dL (ref 30.0–36.0)
MCV: 92.7 fL (ref 80.0–100.0)
Platelets: 347 K/uL (ref 150–400)
RBC: 4.11 MIL/uL (ref 3.87–5.11)
RDW: 12.8 % (ref 11.5–15.5)
WBC: 14.2 K/uL — ABNORMAL HIGH (ref 4.0–10.5)
nRBC: 0 % (ref 0.0–0.2)

## 2024-06-30 LAB — BASIC METABOLIC PANEL WITH GFR
Anion gap: 10 (ref 5–15)
BUN: 12 mg/dL (ref 6–20)
CO2: 27 mmol/L (ref 22–32)
Calcium: 9.4 mg/dL (ref 8.9–10.3)
Chloride: 102 mmol/L (ref 98–111)
Creatinine, Ser: 0.63 mg/dL (ref 0.44–1.00)
GFR, Estimated: >60 mL/min (ref >60)
Glucose, Bld: 126 mg/dL — ABNORMAL HIGH (ref 70–99)
Potassium: 3.9 mmol/L (ref 3.5–5.1)
Sodium: 139 mmol/L (ref 135–145)

## 2024-06-30 LAB — HIV ANTIBODY (ROUTINE TESTING W REFLEX): HIV Screen 4th Generation wRfx: NONREACTIVE

## 2024-06-30 MED ORDER — KETOROLAC TROMETHAMINE 15 MG/ML IJ SOLN
15.0000 mg | Freq: Three times a day (TID) | INTRAMUSCULAR | Status: AC
  Administered 2024-06-30 – 2024-07-01 (×3): 15 mg via INTRAVENOUS
  Filled 2024-06-30 (×3): qty 1

## 2024-06-30 MED ORDER — HYDROMORPHONE HCL 1 MG/ML IJ SOLN
1.0000 mg | Freq: Once | INTRAMUSCULAR | Status: AC
  Administered 2024-06-30: 1 mg via INTRAVENOUS
  Filled 2024-06-30: qty 1

## 2024-06-30 NOTE — Discharge Planning (Signed)
----------------------------------------------  Patient [facility] by the [facility]   Chart reviewed:Yes   Documentation gaps: N/A  Labs, test, and orders reviewed: Yes  30-day Readmission: No  Discharge order: Yes  Current discharge plan:  Short View Care Plan: Discharge home. Long View Care Plan: Patient to follow up outpatient for orthopedic needs.  Barrier to discharge before 11am: Unclear if PT was needed before DC.   Intervention provided by [facility] team: Outreached to [facility] provider Dayle Moores PA to see if PT was needed before DC or if patient could follow up outpatient. Per Kevan, PA the patient can DC.   Barrier resolved: Yes   ALEC Lim, RN [employer]

## 2024-06-30 NOTE — Evaluation (Signed)
 Physical Therapy Evaluation Patient Details Name: Brianna Dixon MRN: [id number] DOB: 1971/08/05 Today's Date: 06/30/2024  History of Present Illness  Brianna Dixon is a 53 y.o. female who has a right unstable trimalleolar ankle fracture; s/p right ORIF trimalleolar ankle fracture 06/29/24. PMH: HTN  Clinical Impression  Pt admitted with above diagnosis. PTA, pt reports fell and broke ankle 10/26, has been modified ind with NWB RLE and using knee scooter at home, navigating 5 steps via knees to enter/exit the home, spouse works, children attend school. On eval, pt able to wiggle toes on R foot, dressing in tact, reports numbness over anterior knee and distal. Pt needing supv with bed mobility, increased time and intermittent linen/pillow assistance. Pt completes transfers with RW and supv, cues for hand placement, maintains R foot elevated off of floor throughout. Pt limited with ambulation to 6 ft then needing seated rest break due to BUE fatigue and pressure in R ankle, CGA with RW. Despite pre-op success with knee scooter, unsuccessful postoperatively. Pt lacking sensation over knee and unaware of placement of leg, very uncoordinated with steering knee scooter requiring min A +2 for safety to prevent fall so returned to RW. Stair training pt appears to respect NWB RLE, min A+2 for safety to ascend with single L handrial and R axillary crutch. Pt begins sweating and appears pale on top of steps, reports overwhelmed by pain, max A+2 to descend steps and safely get into recliner. Plan to continue progressing mobility as able to safely d/c home with family support; pt may benefit from OPPT in future once cleared by surgeon. Pt currently with functional limitations due to the deficits listed below (see PT Problem List). Pt will benefit from acute skilled PT to increase their independence and safety with mobility to allow discharge.           If plan is discharge home, recommend the following: A lot of  help with walking and/or transfers;A little help with bathing/dressing/bathroom;Assistance with cooking/housework;Assist for transportation;Help with stairs or ramp for entrance   Can travel by private vehicle        Equipment Recommendations Other (comment) (RW vs crutches)  Recommendations for Other Services       Functional Status Assessment Patient has had a recent decline in their functional status and demonstrates the ability to make significant improvements in function in a reasonable and predictable amount of time.     Precautions / Restrictions Precautions Precautions: Fall Restrictions Weight Bearing Restrictions Per Provider Order: Yes RLE Weight Bearing Per Provider Order: Non weight bearing      Mobility  Bed Mobility Overal bed mobility: Needs Assistance Bed Mobility: Supine to Sit, Sit to Supine     Supine to sit: Supervision Sit to supine: Supervision   General bed mobility comments: intermittent assist with linen and pillow management    Transfers Overall transfer level: Needs assistance Equipment used: Rolling walker (2 wheels) Transfers: Sit to/from Stand, Bed to chair/wheelchair/BSC Sit to Stand: Supervision   Step pivot transfers: Supervision       General transfer comment: supv for STS and step pivot with RW, cues for hand placement initially with good carryover    Ambulation/Gait Ambulation/Gait assistance: Min assist, Contact guard assist Gait Distance (Feet): 6 Feet (x3) Assistive device: Rolling walker (2 wheels), Knee scooter   Gait velocity: decreased     General Gait Details: attempted knee scooter but pt reports lack of sensation and noted to have poor control requiring min A +2 for  safety to prevent fall; completed gait training with RW and pt able to perform hop-to gait pattern with RLE elevated off of floor consistently, BUE fatigue quickly and increased pressure pain in R ankle limiting distace tolerance to 6 ft x3 reps with  seated rest break between  Stairs Stairs: Yes Stairs assistance: Min assist, Max assist, +2 safety/equipment, +2 physical assistance Stair Management: One rail Left, With crutches Number of Stairs: 3 General stair comments: pt ascends 3 4 steps with L handrail and R axillary crutch, min A+2 for safety to ascend steps; at top of steps pt reports high pain and appears pale with sweating, max A+2 to safely assist pt down steps and into recliner, pt denies dizziness or lightheadedness but reports high pain limiting - notified RN  Wheelchair Mobility     Tilt Bed    Modified Rankin (Stroke Patients Only)       Balance Overall balance assessment: Needs assistance Sitting-balance support: Bilateral upper extremity supported Sitting balance-Leahy Scale: Fair Sitting balance - Comments: not challenged   Standing balance support: Reliant on assistive device for balance, During functional activity, Bilateral upper extremity supported Standing balance-Leahy Scale: Poor                               Pertinent Vitals/Pain Pain Assessment Pain Assessment: 0-10 Pain Score: 9  Pain Location: R ankle Pain Descriptors / Indicators: Pressure Pain Intervention(s): Limited activity within patient's tolerance, Monitored during session, Premedicated before session, Repositioned    Home Living Family/patient expects to be discharged to:: Private residence Living Arrangements: Spouse/significant other;Children Available Help at Discharge: Available PRN/intermittently Type of Home: House Home Access: Stairs to enter Entrance Stairs-Rails: Doctor, General Practice of Steps: 5 Alternate Level Stairs-Number of Steps: flight Home Layout: Two level;Able to live on main level with bedroom/bathroom Home Equipment: Other (comment) (knee scooter)      Prior Function Prior Level of Function : Independent/Modified Independent;Working/employed             Mobility Comments:  pt reports ind prior to fracture 10/26, using knee scooter since fracture 10/26 ADLs Comments: pt reports ind     Extremity/Trunk Assessment   Upper Extremity Assessment Upper Extremity Assessment: Overall WFL for tasks assessed    Lower Extremity Assessment Lower Extremity Assessment: RLE deficits/detail RLE Deficits / Details: dressing in tact, pt able to wiggle toes, knee AROM full extension and knee flexion ~80 deg, hip WFL RLE Sensation: decreased light touch    Cervical / Trunk Assessment Cervical / Trunk Assessment: Normal  Communication   Communication Communication: No apparent difficulties    Cognition Arousal: Alert Behavior During Therapy: WFL for tasks assessed/performed   PT - Cognitive impairments: No apparent impairments                         Following commands: Intact       Cueing Cueing Techniques: Verbal cues     General Comments General comments (skin integrity, edema, etc.): BP 138/85, SpO2 >92% on RA    Exercises     Assessment/Plan    PT Assessment Patient needs continued PT services  PT Problem List Decreased strength;Decreased activity tolerance;Decreased balance;Decreased mobility;Decreased knowledge of use of DME;Decreased safety awareness;Impaired sensation;Pain       PT Treatment Interventions DME instruction;Gait training;Stair training;Functional mobility training;Therapeutic activities;Therapeutic exercise;Balance training;Neuromuscular re-education;Patient/family education    PT Goals (Current goals can be found in the  Care Plan section)  Acute Rehab PT Goals Patient Stated Goal: return home, improved pain PT Goal Formulation: With patient/family Time For Goal Achievement: 07/14/24 Potential to Achieve Goals: Good    Frequency Min 3X/week     Co-evaluation               AM-PAC PT 6 Clicks Mobility  Outcome Measure Help needed turning from your back to your side while in a flat bed without using  bedrails?: A Little Help needed moving from lying on your back to sitting on the side of a flat bed without using bedrails?: A Little Help needed moving to and from a bed to a chair (including a wheelchair)?: A Little Help needed standing up from a chair using your arms (e.g., wheelchair or bedside chair)?: A Little Help needed to walk in hospital room?: A Little Help needed climbing 3-5 steps with a railing? : Total 6 Click Score: 16    End of Session Equipment Utilized During Treatment: Gait belt Activity Tolerance: Patient tolerated treatment well Patient left: in bed;with call bell/phone within reach;with bed alarm set Nurse Communication: Mobility status;Patient requests pain meds PT Visit Diagnosis: Other abnormalities of gait and mobility (R26.89);Difficulty in walking, not elsewhere classified (R26.2);Pain Pain - Right/Left: Right Pain - part of body: Ankle and joints of foot    Time: 8568-8477 PT Time Calculation (min) (ACUTE ONLY): 51 min   Charges:   PT Evaluation $PT Eval Low Complexity: 1 Low PT Treatments $Gait Training: 23-37 mins PT General Charges $$ ACUTE PT VISIT: 1 Visit         Tori Willow Reczek PT, DPT 06/30/24, 3:53 PM

## 2024-06-30 NOTE — Progress Notes (Signed)
 Pt c/o 10/10+ pain to rt ankle, unrelieved by current pain medications. Notified on call provider @ [facility]. Received call back form Franky Moores, [place]. New orders for Dilaudid 1mg  IV X1 & Toradol  15mg  IV Q8hrs x 3 doses.

## 2024-06-30 NOTE — Progress Notes (Signed)
   06/30/24 1159  TOC Brief Assessment  Insurance and Status Reviewed  Patient has primary care physician Yes  Home environment has been reviewed home with spouse  Prior level of function: independent  Social Drivers of Health Review SDOH reviewed no interventions necessary  Readmission risk has been reviewed Yes  Transition of care needs no transition of care needs at this time

## 2024-06-30 NOTE — Discharge Instructions (Signed)
 Orthopedic surgery discharge instructions:  Bandages: -Maintain postoperative bandage until follow-up appointment.  - Keep splint clean, dry and intact  Showering -  Do not shower until follow up  Activity Restrictions -Non weight bearing right lower extremity - Elevate and ice right ankle  Medications - For mild to moderate pain use Tylenol  as needed around-the-clock, 1000 mg every 8 h for pain. Do not take more than this higher levels can affect kidney function - For breakthrough pain use oxycodone as necessary. - Please take a stool softener such as docusate and senna while taking a narcotic pain medication - Aspirin 81 mg twice daily x 6 weeks to avoid blood clots  Follow Up:  -You will return to see Dr. Teresa in the office in 2 weeks for routine postoperative check with x-rays. PT will begin after this visit.

## 2024-06-30 NOTE — Progress Notes (Signed)
   Subjective:  53 y.o. female now POD 1 from ORIF right trimalleolar ankle. Patient reports that her pain was well controlled and her nerve block was functioning.  Otherwise, no acute events overnight. She has numbness of the right ankle and foot in setting of her nerve block  Objective:   VITALS:   Vitals:   06/29/24 2313 06/30/24 0108 06/30/24 0544 06/30/24 0805  BP: (!) 155/81 120/74 (!) 143/78 138/83  Pulse: 71 74 85 82  Resp: 16 18 16 18   Temp: (!) 97.5 F (36.4 C) 97.6 F (36.4 C) 98.3 F (36.8 C) 98 F (36.7 C)  TempSrc: Oral Oral Oral Oral  SpO2: 98% 99% 99% 98%  Weight:      Height:        Physical Exam: General: Alert, no acute distress Cardiovascular: No pedal edema Respiratory: No cyanosis, no use of accessory musculature GI: No organomegaly, abdomen is soft and non-tender Skin: No lesions in the area of chief complaint Neurologic: Sensation intact distally Psychiatric: Patient is competent for consent with normal mood and affect Lymphatic: No axillary or cervical lymphadenopathy  MUSCULOSKELETAL: Right lower extremity - Splint, clean, dry and intact - Unable to feel light touch over the exposed toes - unable to wiggle toes - < 2 sec cap refill  Lab Results  Component Value Date   WBC 14.2 (H) 06/30/2024   HGB 12.5 06/30/2024   HCT 38.1 06/30/2024   MCV 92.7 06/30/2024   PLT 347 06/30/2024   BMET    Component Value Date/Time   NA 139 06/30/2024 0335   K 3.9 06/30/2024 0335   CL 102 06/30/2024 0335   CO2 27 06/30/2024 0335   GLUCOSE 126 (H) 06/30/2024 0335   BUN 12 06/30/2024 0335   CREATININE 0.63 06/30/2024 0335   CALCIUM 9.4 06/30/2024 0335   GFRNONAA >60 06/30/2024 0335     Assessment/Plan: 53 y.o. female  now 1 Day Post-Op from Principal Problem:   Closed displaced trimalleolar fracture of right ankle .  Patient is doing well.  The patient was planned to go home today, but was having difficulty navigating her knee scooter with her  numbness s/p nerve block and was recommended one additional day by physical therapy.  Weight bearing: NWB RLE in splint Pain control: oxy PT/OT DVT ppx: aspirin 81 mg BID Bowel regimen: docusate Dispo: tomorrow morning s/p PT session   Rankin LELON Pizza 06/30/2024, 8:58 AM

## 2024-07-01 DIAGNOSIS — S82851A Displaced trimalleolar fracture of right lower leg, initial encounter for closed fracture: Secondary | ICD-10-CM | POA: Diagnosis not present

## 2024-07-01 MED ORDER — ASPIRIN 81 MG PO CHEW: 81.0000 mg | CHEWABLE_TABLET | Freq: Two times a day (BID) | ORAL | 0 refills | Status: AC

## 2024-07-01 NOTE — Plan of Care (Signed)

## 2024-07-01 NOTE — TOC Initial Note (Addendum)
 Transition of Care [facility]) - Initial/Assessment Note    Patient Details  Name: Brianna Dixon MRN: [id number] Date of Birth: 1971-07-05  Transition of Care [facility]) CM/SW Contact:    Sonda Manuella Quill, RN Phone Number: 07/01/2024, 1:14 PM  Clinical Narrative:                 IP CM for d/c planning; also no PCP listed; spoke w/ pt in room; pt said she lives at home w/ her spouse Brianna Dixon and family; she plans to return w/ their support at d/c; he will provide transportation; pt said she does not have PCP; insurance verified; she denied SDOH risks; pt has knee scooter; she does not have HH services or home oxygen; PT recc crutches vs walker; awaiting their eval; pt agreed to receive resource for [facility] PCPs; pt also encouraged to contact insurance for list of in-network providers; list of [facility] PCPs given to pt; she verbalized understanding she will make her own appt w/ provider of choice; will also need order for new DME; IP CM following.  Expected Discharge Plan: Home/Self Care Barriers to Discharge: No Barriers Identified   Patient Goals and CMS Choice            Expected Discharge Plan and Services   Discharge Planning Services: CM Consult Post Acute Care Choice: Durable Medical Equipment Living arrangements for the past 2 months: Single Family Home Expected Discharge Date: 07/01/24                         HH Arranged: NA HH Agency: NA        Prior Living Arrangements/Services Living arrangements for the past 2 months: Single Family Home Lives with:: Spouse Patient language and need for interpreter reviewed:: Yes Do you feel safe going back to the place where you live?: Yes      Need for Family Participation in Patient Care: Yes (Comment) Care giver support system in place?: Yes (comment) Current home services: DME (knee scooter) Criminal Activity/Legal Involvement Pertinent to Current Situation/Hospitalization: No - Comment as needed  Activities of Daily  Living   ADL Screening (condition at time of admission) Independently performs ADLs?: Yes (appropriate for developmental age) Is the patient deaf or have difficulty hearing?: No Does the patient have difficulty seeing, even when wearing glasses/contacts?: No Does the patient have difficulty concentrating, remembering, or making decisions?: No  Permission Sought/Granted Permission sought to share information with : Case Manager Permission granted to share information with : Yes, Verbal Permission Granted  Share Information with NAME: Case Manager     Permission granted to share info w Relationship: Brianna Dixon (spouse) [phone]     Emotional Assessment Appearance:: Appears stated age Attitude/Demeanor/Rapport: Gracious Affect (typically observed): Accepting Orientation: : Oriented to Self, Oriented to Place, Oriented to  Time, Oriented to Situation Alcohol / Substance Use: Not Applicable Psych Involvement: No (comment)  Admission diagnosis:  Closed displaced trimalleolar fracture of right ankle, initial encounter [S82.851A] Closed displaced trimalleolar fracture of right ankle [S82.851A] Patient Active Problem List   Diagnosis Date Noted   Closed displaced trimalleolar fracture of right ankle 06/29/2024   Multinodular goiter 06/03/2018   Hyperthyroidism 06/03/2018   Bunion 05/04/2018   Leukocytosis 11/05/2007   FATIGUE 11/05/2007   NAUSEA 11/05/2007   PCP:  Pcp, No Pharmacy:   [id number] [address], [place] - [address] [place] [address] Phone: [phone] Fax: [phone]     Social  Drivers of Health (SDOH) Social History: SDOH Screenings   Food Insecurity: No Food Insecurity (07/01/2024)  Housing: Low Risk  (07/01/2024)  Transportation Needs: No Transportation Needs (07/01/2024)  Utilities: Not At Risk (07/01/2024)  Physical Activity: Insufficiently Active (06/15/2024)   Received from [facility] ([facility]  Social  Connections: Moderately Isolated (06/15/2024)   [facility]  Tobacco Use: Medium Risk (06/29/2024)   SDOH Interventions: Food Insecurity Interventions: Intervention Not Indicated, Inpatient TOC Housing Interventions: Intervention Not Indicated, Inpatient TOC Transportation Interventions: Intervention Not Indicated, Inpatient TOC Utilities Interventions: Intervention Not Indicated, Inpatient TOC   Readmission Risk Interventions     No data to display

## 2024-07-01 NOTE — Progress Notes (Signed)
 Physical Therapy Treatment Patient Details Name: Brianna Dixon MRN: [id number] DOB: 1971-04-24 Today's Date: 07/01/2024   History of Present Illness Brianna Dixon is a 53 y.o. female who has a right unstable trimalleolar ankle fracture; s/p right ORIF trimalleolar ankle fracture 06/29/24. PMH: HTN    PT Comments  Pt with improved pain tolerance, amb 15 ft with RW, hop-to pattern and maintains RLE NWB, though still limited by pain in ankle in dependent position and BUE fatiguing, CGA with recliner follow for seated opportunity as needed. Pt completed stair training, able to ascend/descend 2 steps with L handrail and R axillary crutch to simulate home setup, CGA+2 for safety. Pt denies additional stair training as she has 5 steps to enter the home; pt reports confidence in spouse and 3 children (24, 22, 19yo) to assist her as needed at home. Pt reports sleeping on couch with bathroom close by since breaking ankle in October and will continue same setup. Pt also reports having axillary crutches at home; recommend RW at d/c. Pt reports ready to d/c home with family support. Pt may benefit from OPPT in future once cleared by surgeon.    If plan is discharge home, recommend the following: A little help with bathing/dressing/bathroom;Assistance with cooking/housework;Assist for transportation;Help with stairs or ramp for entrance;A little help with walking and/or transfers   Can travel by private vehicle        Equipment Recommendations  Rolling walker (2 wheels)    Recommendations for Other Services       Precautions / Restrictions Precautions Precautions: Fall Restrictions Weight Bearing Restrictions Per Provider Order: Yes RLE Weight Bearing Per Provider Order: Non weight bearing     Mobility  Bed Mobility Overal bed mobility: Modified Independent             General bed mobility comments: increased time, no assistance    Transfers Overall transfer level: Needs  assistance Equipment used: Rolling walker (2 wheels) Transfers: Sit to/from Stand, Bed to chair/wheelchair/BSC Sit to Stand: Supervision Stand pivot transfers: Supervision         General transfer comment: supv for STS and stand pivot transfer wtih RW, initial cues for hand placement with good carryover    Ambulation/Gait Ambulation/Gait assistance: Contact guard assist Gait Distance (Feet): 15 Feet Assistive device: Rolling walker (2 wheels)   Gait velocity: decreased     General Gait Details: attempted knee scooter but pt still without feeling at anterior knee/leg; gait training with RW, hop-to pattern with RLE elevated off of floor, recliner follow as pt fatigues easily and needing seated rest after 15 ft   Stairs   Stairs assistance: Contact guard assist, +2 safety/equipment Stair Management: One rail Left, With crutches Number of Stairs: 2 General stair comments: pt ascends/descends 2 steps with L handrial and R axilary crutch, CGA +2 for safety, good steadiness and use of BUE on handrail and crutch, able to sequence crutch appropriatley; offerred additional stair training to get to 5 but pt politely declines reporting spouse and 3 children (24, 22, 19yo) will be present to assist at home   Wheelchair Mobility     Tilt Bed    Modified Rankin (Stroke Patients Only)       Balance Overall balance assessment: Needs assistance Sitting-balance support: Bilateral upper extremity supported Sitting balance-Leahy Scale: Fair Sitting balance - Comments: not challenged   Standing balance support: Reliant on assistive device for balance, During functional activity, Bilateral upper extremity supported Standing balance-Leahy Scale: Poor  Communication Communication Communication: No apparent difficulties  Cognition Arousal: Alert Behavior During Therapy: WFL for tasks assessed/performed   PT - Cognitive impairments: No apparent  impairments                         Following commands: Intact      Cueing Cueing Techniques: Verbal cues  Exercises      General Comments        Pertinent Vitals/Pain Pain Assessment Pain Assessment: 0-10 Pain Score: 9  Pain Location: R ankle Pain Descriptors / Indicators: Pressure Pain Intervention(s): Limited activity within patient's tolerance, Monitored during session, Premedicated before session, Repositioned, Ice applied    Home Living                          Prior Function            PT Goals (current goals can now be found in the care plan section) Acute Rehab PT Goals Patient Stated Goal: return home, improved pain PT Goal Formulation: With patient/family Time For Goal Achievement: 07/14/24 Potential to Achieve Goals: Good Progress towards PT goals: Progressing toward goals    Frequency    Min 3X/week      PT Plan      Co-evaluation              AM-PAC PT 6 Clicks Mobility   Outcome Measure  Help needed turning from your back to your side while in a flat bed without using bedrails?: A Little Help needed moving from lying on your back to sitting on the side of a flat bed without using bedrails?: A Little Help needed moving to and from a bed to a chair (including a wheelchair)?: A Little Help needed standing up from a chair using your arms (e.g., wheelchair or bedside chair)?: A Little Help needed to walk in hospital room?: A Little Help needed climbing 3-5 steps with a railing? : A Little 6 Click Score: 18    End of Session Equipment Utilized During Treatment: Gait belt Activity Tolerance: Patient tolerated treatment well Patient left: in bed;with call bell/phone within reach;with bed alarm set Nurse Communication: Mobility status PT Visit Diagnosis: Other abnormalities of gait and mobility (R26.89);Difficulty in walking, not elsewhere classified (R26.2);Pain Pain - Right/Left: Right Pain - part of body: Ankle  and joints of foot     Time: 1356-1420 PT Time Calculation (min) (ACUTE ONLY): 24 min  Charges:    $Gait Training: 23-37 mins PT General Charges $$ ACUTE PT VISIT: 1 Visit                     Tori Emmary Culbreath PT, DPT 07/01/24, 2:47 PM

## 2024-07-01 NOTE — TOC Progression Note (Signed)
 Transition of Care [facility]) - Progression Note    Patient Details  Name: RIA REDCAY MRN: [id number] Date of Birth: 29-Nov-1970  [facility] CM/SW Contact  Alfonse JONELLE Rex, RN Phone Number: 07/01/2024, 3:00 PM  Clinical Narrative:   Per PT, patient requires a RW. RW obtained from [facility] equipment room in PACU, delivered to bedside    Expected Discharge Plan: Home/Self Care Barriers to Discharge: No Barriers Identified               Expected Discharge Plan and Services   Discharge Planning Services: CM Consult Post Acute Care Choice: Durable Medical Equipment Living arrangements for the past 2 months: Single Family Home Expected Discharge Date: 07/01/24                         HH Arranged: NA HH Agency: NA         Social Drivers of Health (SDOH) Interventions SDOH Screenings   Food Insecurity: No Food Insecurity (07/01/2024)  Housing: Low Risk  (07/01/2024)  Transportation Needs: No Transportation Needs (07/01/2024)  Utilities: Not At Risk (07/01/2024)  Physical Activity: Insufficiently Active (06/15/2024)   Received from [employer] ([facility]  Social Connections: Moderately Isolated (06/15/2024)   [facility]  Tobacco Use: Medium Risk (06/29/2024)    Readmission Risk Interventions     No data to display

## 2024-07-01 NOTE — Progress Notes (Signed)
 Provided discharge education/instructions. All questions/concerns answered. Pt is not in any distress. Pt to discharge home with all of her belongings accompanied by her husband.

## 2024-07-01 NOTE — Plan of Care (Signed)

## 2024-07-02 NOTE — Discharge Summary (Signed)
 Patient ID: AMERI CAHOON MRN: [id number] DOB/AGE: 1970/09/18 53 y.o.  Admit date: 06/29/2024 Discharge date: 07/01/2024  Primary Diagnosis: Right trimalleolar ankle fracture Admission Diagnoses:  Past Medical History:  Diagnosis Date   Arthritis    Bunion   Hypertension    Multinodular goiter    Seasonal allergies    Discharge Diagnoses:   Principal Problem:   Closed displaced trimalleolar fracture of right ankle  Estimated body mass index is 29.94 kg/m as calculated from the following:   Height as of this encounter: 5' 5 (1.651 m).   Weight as of this encounter: 81.6 kg.  Procedure:  Procedure(s) (LRB): OPEN REDUCTION INTERNAL FIXATION (ORIF) TRIMALLEOLAR ANKLE FRACTURE (Right)   Consults: None  Laboratory Data: Admission on 06/29/2024, Discharged on 07/01/2024  Component Date Value Ref Range Status   Sodium 06/29/2024 139  135 - 145 mmol/L Final   Potassium 06/29/2024 3.8  3.5 - 5.1 mmol/L Final   Chloride 06/29/2024 101  98 - 111 mmol/L Final   CO2 06/29/2024 29  22 - 32 mmol/L Final   Glucose, Bld 06/29/2024 113 (H)  70 - 99 mg/dL Final   Glucose reference range applies only to samples taken after fasting for at least 8 hours.   BUN 06/29/2024 12  6 - 20 mg/dL Final   Creatinine, Ser 06/29/2024 0.62  0.44 - 1.00 mg/dL Final   Calcium 88/94/7974 9.7  8.9 - 10.3 mg/dL Final   GFR, Estimated 06/29/2024 >60  >60 mL/min Final   Comment: (NOTE) Calculated using the CKD-EPI Creatinine Equation (2021)    Anion gap 06/29/2024 9  5 - 15 Final   Performed at [facility], [address], [place], [place] [address]   HIV Screen 4th Generation wRfx 06/30/2024 Non Reactive  Non Reactive Final   Performed at [facility], [address] [place], [place] [address]   WBC 06/30/2024 14.2 (H)  4.0 - 10.5 K/uL Final   RBC 06/30/2024 4.11  3.87 - 5.11 MIL/uL Final   Hemoglobin 06/30/2024 12.5  12.0 - 15.0 g/dL Final   HCT 88/93/7974 38.1  36.0 - 46.0 %  Final   MCV 06/30/2024 92.7  80.0 - 100.0 fL Final   MCH 06/30/2024 30.4  26.0 - 34.0 pg Final   MCHC 06/30/2024 32.8  30.0 - 36.0 g/dL Final   RDW 88/93/7974 12.8  11.5 - 15.5 % Final   Platelets 06/30/2024 347  150 - 400 K/uL Final   nRBC 06/30/2024 0.0  0.0 - 0.2 % Final   Performed at [facility], [address] [place], [place] [address]   Sodium 06/30/2024 139  135 - 145 mmol/L Final   Potassium 06/30/2024 3.9  3.5 - 5.1 mmol/L Final   Chloride 06/30/2024 102  98 - 111 mmol/L Final   CO2 06/30/2024 27  22 - 32 mmol/L Final   Glucose, Bld 06/30/2024 126 (H)  70 - 99 mg/dL Final   Glucose reference range applies only to samples taken after fasting for at least 8 hours.   BUN 06/30/2024 12  6 - 20 mg/dL Final   Creatinine, Ser 06/30/2024 0.63  0.44 - 1.00 mg/dL Final   Calcium 88/93/7974 9.4  8.9 - 10.3 mg/dL Final   GFR, Estimated 06/30/2024 >60  >60 mL/min Final   Comment: (NOTE) Calculated using the CKD-EPI Creatinine Equation (2021)    Anion gap 06/30/2024 10  5 - 15 Final   Performed at [facility],  [address], [place], [place] [address]     X-Rays:DG Ankle 2 Views Right Result Date: 06/29/2024 CLINICAL DATA:  Elective surgery EXAM: RIGHT ANKLE - 2 VIEW COMPARISON:  06/19/2024 FINDINGS: multiple low resolution intraoperative spot views of the right ankle including 1 seen a series. Total fluoroscopy time was 1 minutes 42 seconds, fluoroscopic dose of 2.8264 mGy. Images were obtained during surgical plate and screw fixation of the distal fibula and medial malleolus for trimalleolar fracture. IMPRESSION: Intraoperative fluoroscopic assistance provided during right ankle surgery Electronically Signed   By: Luke Bun M.D.   On: 06/29/2024 22:30   DG C-Arm 1-60 Min-No Report Result Date: 06/29/2024 Fluoroscopy was utilized by the requesting physician.  No radiographic interpretation.   DG C-Arm 1-60 Min-No Report Result Date:  06/29/2024 Fluoroscopy was utilized by the requesting physician.  No radiographic interpretation.   DG C-Arm 1-60 Min-No Report Result Date: 06/29/2024 Fluoroscopy was utilized by the requesting physician.  No radiographic interpretation.   DG Ankle Complete Right Result Date: 06/19/2024 EXAM: 3 VIEW(S) XRAY OF THE RIGHT ANKLE 06/19/2024 08:52:00 PM CLINICAL HISTORY: Known right ankle fracture, want post-splint films. COMPARISON: Right ankle x-ray 10/26. FINDINGS: BONES AND JOINTS: Trimalleolar ankle fractures are again seen. Alignment has improved and fracture fragments are in near anatomic alignment. No joint dislocation. SOFT TISSUES: There is soft tissue swelling surrounding the ankle. IMPRESSION: 1. Trimalleolar ankle fractures in near anatomic alignment. 2. Soft tissue swelling surrounding the ankle. Electronically signed by: Greig Pique MD 06/19/2024 08:56 PM EDT RP Workstation: [id number]   DG Ankle Complete Right Result Date: 06/19/2024 EXAM: 3 or more VIEW(S) XRAY OF THE RIGHT ANKLE 06/19/2024 04:56:00 PM CLINICAL HISTORY: deformity. Pt from home with right ankle pain/deformity from fall. COMPARISON: None available. FINDINGS: BONES AND JOINTS: There is an acute transverse fracture through the medial malleolus with fracture fragments distracted 2.5 mm. There is an acute oblique fracture through the distal fibula with fracture fragments distracted up to 2.5 mm. There is an acute posterior malleolar fracture with fragments distracted 2 mm. There is no evidence for dislocation. SOFT TISSUES: There is soft tissue swelling surrounding the ankle. IMPRESSION: 1. Acute trimalleolar ankle fracture. 2. No evidence for dislocation. Electronically signed by: Greig Pique MD 06/19/2024 05:17 PM EDT RP Workstation: [id number]    EKG: Orders placed or performed during the hospital encounter of 06/29/24   EKG 12 lead per protocol   EKG 12-Lead   EKG 12-Lead   EKG 12 lead per protocol     Hospital  Course: RHIANNA RAULERSON is a 53 y.o. who was admitted to Hospital. They were brought to the operating room on 06/29/2024 and underwent Procedure(s): OPEN REDUCTION INTERNAL FIXATION (ORIF) TRIMALLEOLAR ANKLE FRACTURE.  Patient tolerated the procedure well and was later transferred to the recovery room and then to the orthopaedic floor for postoperative care.  They were given PO and IV analgesics for pain control following their surgery.  They were given 24 hours of postoperative antibiotics of  Anti-infectives (From admission, onward)    Start     Dose/Rate Route Frequency Ordered Stop   06/30/24 0400  ceFAZolin (ANCEF) IVPB 1 g/50 mL premix        1 g 100 mL/hr over 30 Minutes Intravenous Every 8 hours 06/29/24 1934 06/30/24 1317   06/29/24 1245  ceFAZolin (ANCEF) IVPB 2g/100 mL premix        2 g 200 mL/hr over 30 Minutes Intravenous On call to O.R. 06/29/24 1228 06/29/24 1934  and started on DVT prophylaxis in the form of Aspirin.   PT and OT were ordered to assist the patient with post procedure ambulation.  She had difficulty with successful ambulation post operatively due to difficulty with proprioception s/p nerve block. Patient had an uneventful night on the evening of surgery.  They started to get up OOB with therapy on day one.   By day two, the patient had progressed with therapy and meeting their goals  Patient was seen and cleared by appropriate care teams for discharge and was ready to go home.   Diet: Regular diet Activity:NWB RLE Follow-up:in 2 weeks Disposition - Home Discharged Condition: good   Discharge Instructions     Call MD / Call 911   Complete by: As directed    If you experience chest pain or shortness of breath, CALL 911 and be transported to the hospital emergency room.  If you develope a fever above 101 F, pus (Tamara Monteith drainage) or increased drainage or redness at the wound, or calf pain, call your surgeon's office.   Constipation Prevention   Complete by: As  directed    Drink plenty of fluids.  Prune juice may be helpful.  You may use a stool softener, such as Colace (over the counter) 100 mg twice a day.  Use MiraLax (over the counter) for constipation as needed.   Diet - low sodium heart healthy   Complete by: As directed    Increase activity slowly as tolerated   Complete by: As directed    Non weight bearing   Complete by: As directed    Laterality: right   Extremity: Lower   Post-operative opioid taper instructions:   Complete by: As directed    POST-OPERATIVE OPIOID TAPER INSTRUCTIONS: It is important to wean off of your opioid medication as soon as possible. If you do not need pain medication after your surgery it is ok to stop day one. Opioids include: Codeine, Hydrocodone(Norco, Vicodin), Oxycodone(Percocet, oxycontin) and hydromorphone amongst others.  Long term and even short term use of opiods can cause: Increased pain response Dependence Constipation Depression Respiratory depression And more.  Withdrawal symptoms can include Flu like symptoms Nausea, vomiting And more Techniques to manage these symptoms Hydrate well Eat regular healthy meals Stay active Use relaxation techniques(deep breathing, meditating, yoga) Do Not substitute Alcohol to help with tapering If you have been on opioids for less than two weeks and do not have pain than it is ok to stop all together.  Plan to wean off of opioids This plan should start within one week post op of your joint replacement. Maintain the same interval or time between taking each dose and first decrease the dose.  Cut the total daily intake of opioids by one tablet each day Next start to increase the time between doses. The last dose that should be eliminated is the evening dose.         Allergies as of 07/01/2024       Reactions   Hydrocodone-acetaminophen  Nausea And Vomiting   Dizzy and very sleepy  (hard to wake up)        Medication List     TAKE these  medications    acetaminophen  325 MG tablet Commonly known as: TYLENOL  Take 650 mg by mouth every 6 (six) hours as needed for moderate pain (pain score 4-6). What changed: Another medication with the same name was added. Make sure you understand how and when to take each. Notes to patient: Last  dose given 11/06 03:34am   acetaminophen  325 MG tablet Commonly known as: Tylenol  Take 3 tablets (975 mg total) by mouth every 8 (eight) hours for 20 days. What changed: You were already taking a medication with the same name, and this prescription was added. Make sure you understand how and when to take each.   aspirin 81 MG chewable tablet Chew 1 tablet (81 mg total) by mouth 2 (two) times daily.   busPIRone 5 MG tablet Commonly known as: BUSPAR Take 5 mg by mouth 2 (two) times daily. Notes to patient: Resume home regimen   oxyCODONE 5 MG immediate release tablet Commonly known as: Roxicodone Take 1 tablet (5 mg total) by mouth every 6 (six) hours as needed for severe pain (pain score 7-10) or breakthrough pain. Notes to patient: Last dose taken: 11/07 at 07:18am //    propranolol 60 MG tablet Commonly known as: INDERAL Take 60 mg by mouth every evening. Notes to patient: Resume home regimen   senna-docusate 8.6-50 MG tablet Commonly known as: Senokot-S Take 1 tablet by mouth daily for 20 days.               Discharge Care Instructions  (From admission, onward)           Start     Ordered   06/29/24 0000  Non weight bearing       Question Answer Comment  Laterality right   Extremity Lower      06/29/24 1240            Follow-up Information     Teresa Rankin ORN, MD Follow up in 2 week(s).   Specialty: Orthopedic Surgery Contact information: [address] [place] [address] [place] [address] [phone]                 Signed: Rankin Teresa, MD Orthopaedic Surgery 07/02/2024, 7:35 AM
